# Patient Record
Sex: Male | Born: 1980 | Race: Black or African American | Hispanic: No | Marital: Married | Smoking: Former smoker
Health system: Southern US, Community
[De-identification: ages and names within clinical notes are randomized; demographics above are authoritative.]

## PROBLEM LIST (undated history)

## (undated) DIAGNOSIS — E119 Type 2 diabetes mellitus without complications: Secondary | ICD-10-CM

## (undated) DIAGNOSIS — I10 Essential (primary) hypertension: Secondary | ICD-10-CM

## (undated) DIAGNOSIS — M25569 Pain in unspecified knee: Secondary | ICD-10-CM

## (undated) DIAGNOSIS — G8929 Other chronic pain: Secondary | ICD-10-CM

---

## 2009-10-24 ENCOUNTER — Ambulatory Visit: Payer: Self-pay | Admitting: Diagnostic Radiology

## 2009-10-24 ENCOUNTER — Emergency Department (HOSPITAL_BASED_OUTPATIENT_CLINIC_OR_DEPARTMENT_OTHER): Admission: EM | Admit: 2009-10-24 | Discharge: 2009-10-24 | Payer: Self-pay | Admitting: Emergency Medicine

## 2009-12-04 ENCOUNTER — Emergency Department (HOSPITAL_BASED_OUTPATIENT_CLINIC_OR_DEPARTMENT_OTHER): Admission: EM | Admit: 2009-12-04 | Discharge: 2009-12-04 | Payer: Self-pay | Admitting: Emergency Medicine

## 2010-01-24 ENCOUNTER — Emergency Department (HOSPITAL_BASED_OUTPATIENT_CLINIC_OR_DEPARTMENT_OTHER): Admission: EM | Admit: 2010-01-24 | Discharge: 2010-01-24 | Payer: Self-pay | Admitting: Emergency Medicine

## 2010-12-04 LAB — DIFFERENTIAL
Basophils Absolute: 0.1 10*3/uL (ref 0.0–0.1)
Lymphocytes Relative: 46 % (ref 12–46)
Lymphs Abs: 3.9 10*3/uL (ref 0.7–4.0)
Monocytes Relative: 8 % (ref 3–12)
Neutrophils Relative %: 43 % (ref 43–77)

## 2010-12-04 LAB — BASIC METABOLIC PANEL
BUN: 14 mg/dL (ref 6–23)
CO2: 27 mEq/L (ref 19–32)
Calcium: 9.1 mg/dL (ref 8.4–10.5)
Creatinine, Ser: 1.2 mg/dL (ref 0.4–1.5)
GFR calc non Af Amer: >60 mL/min (ref >60)
Potassium: 3.9 mEq/L (ref 3.5–5.1)

## 2010-12-04 LAB — CBC
MCHC: 33.5 g/dL (ref 30.0–36.0)
Platelets: 274 10*3/uL (ref 150–400)
RDW: 12.4 % (ref 11.5–15.5)

## 2010-12-04 LAB — CK: Total CK: 586 U/L — ABNORMAL HIGH (ref 7–232)

## 2010-12-04 LAB — POCT CARDIAC MARKERS
CKMB, poc: 1.9 ng/mL (ref 1.0–8.0)
Myoglobin, poc: 117 ng/mL (ref 12–200)
Troponin i, poc: <0.05 ng/mL (ref 0.00–0.09)

## 2010-12-06 LAB — BASIC METABOLIC PANEL WITH GFR
Calcium: 9.8 mg/dL (ref 8.4–10.5)
Creatinine, Ser: 1.2 mg/dL (ref 0.4–1.5)
GFR calc Af Amer: >60 mL/min (ref >60)
Glucose, Bld: 105 mg/dL — ABNORMAL HIGH (ref 70–99)
Potassium: 4 meq/L (ref 3.5–5.1)
Sodium: 143 meq/L (ref 135–145)

## 2010-12-06 LAB — BASIC METABOLIC PANEL
BUN: 12 mg/dL (ref 6–23)
CO2: 30 mEq/L (ref 19–32)
Chloride: 100 mEq/L (ref 96–112)
GFR calc non Af Amer: >60 mL/min (ref >60)

## 2010-12-06 LAB — CBC
HCT: 45.5 % (ref 39.0–52.0)
Hemoglobin: 15.8 g/dL (ref 13.0–17.0)
MCHC: 34.6 g/dL (ref 30.0–36.0)
MCV: 87.4 fL (ref 78.0–100.0)
Platelets: 289 K/uL (ref 150–400)
RBC: 5.21 MIL/uL (ref 4.22–5.81)
RDW: 12.5 % (ref 11.5–15.5)
WBC: 8.9 K/uL (ref 4.0–10.5)

## 2010-12-06 LAB — POCT CARDIAC MARKERS
CKMB, poc: 2.5 ng/mL (ref 1.0–8.0)
Myoglobin, poc: 104 ng/mL (ref 12–200)
Troponin i, poc: <0.05 ng/mL (ref 0.00–0.09)

## 2011-08-13 ENCOUNTER — Emergency Department (INDEPENDENT_AMBULATORY_CARE_PROVIDER_SITE_OTHER): Payer: BC Managed Care – PPO

## 2011-08-13 ENCOUNTER — Emergency Department (HOSPITAL_BASED_OUTPATIENT_CLINIC_OR_DEPARTMENT_OTHER)
Admission: EM | Admit: 2011-08-13 | Discharge: 2011-08-13 | Disposition: A | Discharge status: Home / Self Care | Payer: BC Managed Care – PPO | Attending: Emergency Medicine | Admitting: Emergency Medicine

## 2011-08-13 ENCOUNTER — Encounter: Payer: Self-pay | Admitting: *Deleted

## 2011-08-13 DIAGNOSIS — R059 Cough, unspecified: Secondary | ICD-10-CM | POA: Insufficient documentation

## 2011-08-13 DIAGNOSIS — J40 Bronchitis, not specified as acute or chronic: Secondary | ICD-10-CM

## 2011-08-13 DIAGNOSIS — R05 Cough: Secondary | ICD-10-CM

## 2011-08-13 DIAGNOSIS — R0989 Other specified symptoms and signs involving the circulatory and respiratory systems: Secondary | ICD-10-CM

## 2011-08-13 HISTORY — DX: Essential (primary) hypertension: I10

## 2011-08-13 MED ORDER — ALBUTEROL SULFATE HFA 108 (90 BASE) MCG/ACT IN AERS
2.0000 | INHALATION_SPRAY | RESPIRATORY_TRACT | Status: DC | PRN
  Administered 2011-08-13: 2 via RESPIRATORY_TRACT
  Filled 2011-08-13: qty 6.7

## 2011-08-13 MED ORDER — ONDANSETRON 4 MG PO TBDP
4.0000 mg | ORAL_TABLET | Freq: Once | ORAL | Status: AC
  Administered 2011-08-13: 4 mg via ORAL
  Filled 2011-08-13: qty 1

## 2011-08-13 NOTE — ED Provider Notes (Signed)
History     CSN: [id number] Arrival date & time: 08/13/2011  5:06 PM   First MD Initiated Contact with Patient 08/13/11 1723      Chief Complaint  Patient presents with  . Cough  . Nasal Congestion    (Consider location/radiation/quality/duration/timing/severity/associated sxs/prior treatment) HPI Comments: Patient presents with cough, congestion for 2 days. He some nausea today and had one episode of emesis. He denies any fevers but does complain of diffuse body aches and chills. He said no sick contacts, abdominal pain, chest pain.  His cough is dry nonproductive he has had no smoke exposure.  History of hypertension states compliance with medications. Denies any diarrhea, abdominal pain, sore throat, rhinorrhea.  The history is provided by the patient.    Past Medical History  Diagnosis Date  . Hypertension     History reviewed. No pertinent past surgical history.  History reviewed. No pertinent family history.  History  Substance Use Topics  . Smoking status: Never Smoker   . Smokeless tobacco: Not on file  . Alcohol Use: No      Review of Systems  Constitutional: Positive for chills. Negative for fever, activity change and appetite change.  HENT: Positive for congestion and rhinorrhea. Negative for trouble swallowing, neck pain and neck stiffness.   Respiratory: Positive for cough. Negative for shortness of breath.   Cardiovascular: Negative for chest pain.  Gastrointestinal: Positive for nausea and vomiting. Negative for abdominal pain.  Genitourinary: Negative for dysuria and hematuria.  Musculoskeletal: Positive for myalgias and arthralgias. Negative for back pain.  Skin: Negative for rash.  Neurological: Negative for weakness and headaches.    Allergies  Review of patient's allergies indicates no known allergies.  Home Medications   Current Outpatient Rx  Name Route Sig Dispense Refill  . BENAZEPRIL HCL 20 MG PO TABS Oral Take 20 mg by mouth daily.       Marland Kitchen LORAZEPAM 1 MG PO TABS Oral Take 1 mg by mouth daily as needed. For anxiety    . PSEUDOEPHEDRINE HCL 30 MG PO TABS Oral Take 30 mg by mouth once.      Marland Kitchen PSEUDOEPHEDRINE-APAP-DM 40-981-19 MG/30ML PO LIQD Oral Take 30 mLs by mouth every 6 (six) hours as needed. For congestion       BP 153/78  Pulse 73  Temp(Src) 98.1 F (36.7 C) (Oral)  Resp 18  SpO2 100%  Physical Exam  Constitutional: He is oriented to person, place, and time. He appears well-developed and well-nourished. No distress.       texting on phone  HENT:  Head: Normocephalic and atraumatic.  Eyes: Conjunctivae are normal. Pupils are equal, round, and reactive to light.  Neck: Normal range of motion. Neck supple.       No meningismus  Cardiovascular: Normal rate, regular rhythm and normal heart sounds.   Pulmonary/Chest: Effort normal and breath sounds normal. No respiratory distress. He has no wheezes. He exhibits no tenderness.  Abdominal: Soft. There is no tenderness. There is no rebound and no guarding.  Musculoskeletal: Normal range of motion. He exhibits no edema and no tenderness.  Lymphadenopathy:    He has no cervical adenopathy.  Neurological: He is alert and oriented to person, place, and time. No cranial nerve deficit.  Skin: Skin is warm.    ED Course  Procedures (including critical care time)  Labs Reviewed - No data to display Dg Chest 2 View  08/13/2011  *RADIOLOGY REPORT*  Clinical Data: Cough and congestion.  CHEST -  2 VIEW  Comparison: Chest x-ray 10/24/2009.  Findings: The cardiac silhouette, mediastinal and hilar contours are within normal limits and stable. The lungs are clear.  No pleural effusions.  The bony thorax is intact.  IMPRESSION: Normal chest x-ray.  No change since prior study.  Original Report Authenticated By: P. Loralie Champagne, M.D.     1. Bronchitis       MDM  Cough, cold symptoms for the past day. Patient is nontoxic on exam and stable vital signs. Lungs are clear to  auscultation. We'll treat as viral syndrome with antipyretics, hydration and recheck by his primary doctor.  Chest x-ray is clear. Will treat as viral bronchitis.      Glynn Octave, MD 08/13/11 [phone]

## 2011-08-13 NOTE — ED Notes (Signed)
Pt amb to triage with quick steady gait in nad. Pt reports cough and congestion x 2 days, emesis x 1 today. Denies any fevers or other c/o.

## 2014-04-04 ENCOUNTER — Emergency Department (HOSPITAL_BASED_OUTPATIENT_CLINIC_OR_DEPARTMENT_OTHER)
Admission: EM | Admit: 2014-04-04 | Discharge: 2014-04-04 | Disposition: A | Discharge status: Home / Self Care | Payer: BC Managed Care – PPO | Attending: Emergency Medicine | Admitting: Emergency Medicine

## 2014-04-04 ENCOUNTER — Encounter (HOSPITAL_BASED_OUTPATIENT_CLINIC_OR_DEPARTMENT_OTHER): Payer: Self-pay | Admitting: Emergency Medicine

## 2014-04-04 ENCOUNTER — Emergency Department (HOSPITAL_BASED_OUTPATIENT_CLINIC_OR_DEPARTMENT_OTHER): Payer: BC Managed Care – PPO

## 2014-04-04 DIAGNOSIS — G8929 Other chronic pain: Secondary | ICD-10-CM | POA: Insufficient documentation

## 2014-04-04 DIAGNOSIS — Z79899 Other long term (current) drug therapy: Secondary | ICD-10-CM | POA: Insufficient documentation

## 2014-04-04 DIAGNOSIS — I10 Essential (primary) hypertension: Secondary | ICD-10-CM | POA: Insufficient documentation

## 2014-04-04 DIAGNOSIS — M25561 Pain in right knee: Secondary | ICD-10-CM

## 2014-04-04 DIAGNOSIS — M25569 Pain in unspecified knee: Secondary | ICD-10-CM | POA: Insufficient documentation

## 2014-04-04 HISTORY — DX: Other chronic pain: G89.29

## 2014-04-04 HISTORY — DX: Pain in unspecified knee: M25.569

## 2014-04-04 MED ORDER — TRAMADOL HCL 50 MG PO TABS: 50.0000 mg | ORAL_TABLET | Freq: Four times a day (QID) | ORAL | Status: DC | PRN

## 2014-04-04 NOTE — Discharge Instructions (Signed)

## 2014-04-04 NOTE — ED Notes (Signed)
Patient transported to X-ray 

## 2014-04-04 NOTE — ED Provider Notes (Signed)
CSN: [id number]     Arrival date & time 04/04/14  [id number] History   First MD Initiated Contact with Patient 04/04/14 [phone]     Chief Complaint  Patient presents with  . Knee Pain     (Consider location/radiation/quality/duration/timing/severity/associated sxs/prior Treatment) HPI Comments: Pt states that he was lifting furniture for his job the other day and he but his right knee into the metal frame of a recliner. Has had pain in the area since. State that it gave out on him this morning. States that he would "get fluid" on it the past. No known injury. Pt denies numbness. No swelling  The history is provided by the patient. No language interpreter was used.    Past Medical History  Diagnosis Date  . Hypertension   . Chronic knee pain    History reviewed. No pertinent past surgical history. No family history on file. History  Substance Use Topics  . Smoking status: Never Smoker   . Smokeless tobacco: Not on file  . Alcohol Use: Yes     Comment: 24 oz beer every other week    Review of Systems  Constitutional: Negative.   Respiratory: Negative.   Cardiovascular: Negative.       Allergies  Review of patient's allergies indicates no known allergies.  Home Medications   Prior to Admission medications   Medication Sig Start Date End Date Taking? Authorizing Provider  benazepril (LOTENSIN) 20 MG tablet Take 20 mg by mouth daily.      Historical Provider, MD  LORazepam (ATIVAN) 1 MG tablet Take 1 mg by mouth daily as needed. For anxiety    Historical Provider, MD  pseudoephedrine (SUDAFED) 30 MG tablet Take 30 mg by mouth once.      Historical Provider, MD  Pseudoephedrine-APAP-DM (DAYQUIL MULTI-SYMPTO) 47-829-56) 60-650-20 MG/30ML LIQD Take 30 mLs by mouth every 6 (six) hours as needed. For congestion     Historical Provider, MD   BP 138/56  Pulse 56  Temp(Src) 98.9 F (37.2 C) (Oral)  Resp 18  Ht 6\' 2"  (1.88 m)  Wt 322 lb (146.058 kg)  BMI 41.32 kg/m2  SpO2 98% Physical Exam   Nursing note and vitals reviewed. Constitutional: He appears well-developed and well-nourished.  Cardiovascular: Normal rate and regular rhythm.   Pulmonary/Chest: Effort normal and breath sounds normal.  Musculoskeletal: Normal range of motion.  Tender on the lateral right knee.full rom. No redness warmth or swelling noted to the area  Neurological: He is alert.  Skin: Skin is warm and dry.    ED Course  Procedures (including critical care time) Labs Review Labs Reviewed - No data to display  Imaging Review Dg Knee Complete 4 Views Right  04/04/2014   CLINICAL DATA:  Blow to the right knee.  Pain.  EXAM: RIGHT KNEE - COMPLETE 4+ VIEW  COMPARISON:  None.  FINDINGS: Imaged bones, joints and soft tissues appear normal.  IMPRESSION: Negative exam.   Electronically Signed   By: Drusilla Kannerhomas  Dalessio M.D.   On: 04/04/2014 10:38     EKG Interpretation None      MDM   Final diagnoses:  Right knee pain    No acute bony abnormality noted. To swelling or redness noted. Pt placed in knee sleeve for comfort. Will have pt follow up with Dr. Pearletha Forgehudnall as needed    Teressa LowerVrinda Shakeia Krus, NP 04/04/14 1112

## 2014-04-04 NOTE — ED Notes (Signed)
Right need pain that is chronic, however stayed hurting worse after moving furniture 2 days ago and putting knee in metal part of sofa.  Pain is worse with weight bearing.

## 2014-04-04 NOTE — ED Notes (Signed)
Jeremy LowerVrinda Mueller at bedside.

## 2014-04-04 NOTE — ED Provider Notes (Signed)
History/physical exam/procedure(s) were performed by non-physician practitioner and as supervising physician I was immediately available for consultation/collaboration. I have reviewed all notes and am in agreement with care and plan.   Hilario Quarryanielle S Chalsea Darko, MD 04/04/14 1450

## 2014-04-26 ENCOUNTER — Encounter (HOSPITAL_BASED_OUTPATIENT_CLINIC_OR_DEPARTMENT_OTHER): Payer: Self-pay | Admitting: Emergency Medicine

## 2014-04-26 ENCOUNTER — Emergency Department (HOSPITAL_BASED_OUTPATIENT_CLINIC_OR_DEPARTMENT_OTHER)
Admission: EM | Admit: 2014-04-26 | Discharge: 2014-04-26 | Disposition: A | Discharge status: Home / Self Care | Payer: BC Managed Care – PPO | Attending: Emergency Medicine | Admitting: Emergency Medicine

## 2014-04-26 DIAGNOSIS — M25519 Pain in unspecified shoulder: Secondary | ICD-10-CM | POA: Insufficient documentation

## 2014-04-26 DIAGNOSIS — M7552 Bursitis of left shoulder: Secondary | ICD-10-CM

## 2014-04-26 DIAGNOSIS — M7520 Bicipital tendinitis, unspecified shoulder: Secondary | ICD-10-CM | POA: Diagnosis not present

## 2014-04-26 DIAGNOSIS — M75100 Unspecified rotator cuff tear or rupture of unspecified shoulder, not specified as traumatic: Secondary | ICD-10-CM | POA: Diagnosis not present

## 2014-04-26 DIAGNOSIS — Z79899 Other long term (current) drug therapy: Secondary | ICD-10-CM | POA: Diagnosis not present

## 2014-04-26 DIAGNOSIS — I10 Essential (primary) hypertension: Secondary | ICD-10-CM | POA: Insufficient documentation

## 2014-04-26 DIAGNOSIS — G8929 Other chronic pain: Secondary | ICD-10-CM | POA: Insufficient documentation

## 2014-04-26 DIAGNOSIS — M7522 Bicipital tendinitis, left shoulder: Secondary | ICD-10-CM

## 2014-04-26 DIAGNOSIS — IMO0002 Reserved for concepts with insufficient information to code with codable children: Secondary | ICD-10-CM | POA: Diagnosis not present

## 2014-04-26 MED ORDER — HYDROCODONE-ACETAMINOPHEN 5-325 MG PO TABS: 2.0000 | ORAL_TABLET | ORAL | Status: DC | PRN

## 2014-04-26 NOTE — ED Notes (Signed)
C/o left shoulder pain x 1 week-denies specific injury-does lift at work

## 2014-04-26 NOTE — Discharge Instructions (Signed)
Biceps Tendon Tendinitis (Proximal) and Tenosynovitis with Rehab Tendonitis and tenosynovitis involve inflammation of the tendon and the tendon lining (sheath). The proximal biceps tendon is vulnerable to tendonitis and tenosynovitis, which causes pain and discomfort in the front of the shoulder and upper arm. The tendon lining secretes a fluid that helps lubricate the tendon, allowing for proper function without pain. When the tendon and its lining become inflamed, the tendon can no longer glide smoothly, causing pain. The proximal biceps tendon connects the biceps muscle to two bones of the shoulder. It is important for proper function of the elbow and turning the palm upward (supination) using the wrist. Proximal biceps tendon tendinitis may include a grade 1 or 2 strain of the tendon. Grade 1 strains involve a slight pull of the tendon without signs of tearing and no observed tendon lengthening. There is also no loss of strength. Grade 2 strains involve small tears in the tendon fibers. The tendon or muscle is stretched and strength is usually decreased.  SYMPTOMS   Pain, tenderness, swelling, warmth, or redness over the front of the shoulder.  Pain that gets worse with shoulder and elbow use, especially against resistance.  Limited motion of the shoulder or elbow.  Crackling sound (crepitation) when the tendon or shoulder is moved or touched. CAUSES  The symptoms of biceps tendonitis are due to inflammation of the tendon. Inflammation may be caused by:  Strain from sudden increase in amount or intensity of activity.  Direct blow or injury to the elbow (uncommon).  Overuse or repetitive elbow bending or wrist rotation, particularly when turning the palm up, or with elbow hyperextension. RISK INCREASES WITH:  Sports that involve contact or overhead arm activity (throwing sports, gymnastics, weightlifting, bodybuilding, rock climbing).  Heavy labor.  Poor strength and  flexibility.  Failure to warm up properly before activity. PREVENTION  Warm up and stretch properly before activity.  Allow time for recovery between activities.  Maintain physical fitness:  Strength, flexibility, and endurance.  Cardiovascular fitness.  Learn and use proper exercise technique. PROGNOSIS  With proper treatment, proximal biceps tendon tendonitis and tenosynovitis is usually curable within 6 weeks. Healing is usually quicker if the cause was a direct blow, not overuse.  RELATED COMPLICATIONS   Longer healing time if not properly treated or if not given enough time to heal.  Chronically inflamed tendon that causes persistent pain with activity, that may progress to constant pain and potentially rupture of the tendon.  Recurring symptoms, especially if activity is resumed too soon or with overuse, a direct blow, or use of poor exercise technique. TREATMENT Treatment first involves ice and medicine, to reduce pain and inflammation. It is helpful to modify activities that cause pain, to reduce the chances of causing the condition to get worse. Strengthening and stretching exercises should be performed to promote proper use of the muscles of the shoulder. These exercises may be performed at home or with a therapist. Other treatments may be given such as ultrasound or heat therapy. A corticosteroid injection may be recommended to help reduce inflammation of the tendon lining. Surgery is usually not necessary. Sometimes, if symptoms last for greater than 6 months, surgery will be advised to detach the tendon and re-insert it into the arm bone. Surgery to correct other shoulder problems that may be contributing to tendinitis may be advised before surgery for the tendinitis itself.  MEDICATION  If pain medicine is needed, nonsteroidal anti-inflammatory medicines (aspirin and ibuprofen), or other minor pain relievers (  acetaminophen), are often advised.  Do not take pain medicine  for 7 days before surgery.  Prescription pain relievers may be given if your caregiver thinks they are needed. Use only as directed and only as much as you need.  Corticosteroid injections may be given. These injections should only be used on the most severe cases, as one can only receive a limited number of them. HEAT AND COLD   Cold treatment (icing) should be applied for 10 to 15 minutes every 2 to 3 hours for inflammation and pain, and immediately after activity that aggravates your symptoms. Use ice packs or an ice massage.  Heat treatment may be used before performing stretching and strengthening activities prescribed by your caregiver, physical therapist, or athletic trainer. Use a heat pack or a warm water soak. SEEK MEDICAL CARE IF:   Symptoms get worse or do not improve in 2 weeks, despite treatment.  New, unexplained symptoms develop. (Drugs used in treatment may produce side effects.) EXERCISES RANGE OF MOTION (ROM) AND EXERCISES - Biceps Tendon (Proximal) and Tenosynovitis These exercises may help you when beginning to rehabilitate your injury. Your symptoms may go away with or without further involvement from your physician, physical therapist, or athletic trainer. While completing these exercises, remember:   Restoring tissue flexibility helps normal motion to return to the joints. This allows healthier, less painful movement and activity.  An effective stretch should be held for at least 30 seconds.  A stretch should never be painful. You should only feel a gentle lengthening or release in the stretched tissue. STRETCH - Flexion, Standing  Stand with good posture. With an underhand grip on your right / left hand and an overhand grip on the opposite hand, grasp a broomstick or cane so that your hands are a little more than shoulder width apart.  Keeping your right / left elbow straight and shoulder muscles relaxed, push the stick with your opposite hand to raise your right  / left arm in front of your body and then overhead. Raise your arm until you feel a stretch in your right / left shoulder, but before you have increased shoulder pain.  Try to avoid shrugging your right / left shoulder as your arm rises, by keeping your shoulder blade tucked down and toward your mid-back spine. Hold for __________ seconds.  Slowly return to the starting position. Repeat __________ times. Complete this exercise __________ times per day. STRETCH - Abduction, Supine  Lie on your back. With an underhand grip on your right / left hand and an overhand grip on the opposite hand, grasp a broomstick or cane so that your hands are a little more than shoulder width apart.  Keeping your right / left elbow straight and shoulder muscles relaxed, push the stick with your opposite hand to raise your right / left arm out to the side of your body and then overhead. Raise your arm until you feel a stretch in your right / left shoulder, but before you have increased shoulder pain.  Try to avoid shrugging your right / left shoulder as your arm rises, by keeping your shoulder blade tucked down and toward your mid-back spine. Hold for __________ seconds.  Slowly return to the starting position. Repeat __________ times. Complete this exercise __________ times per day. ROM - Flexion, Active-Assisted  Lie on your back. You may bend your knees for comfort.  Grasp a broomstick or cane so your hands are about shoulder width apart. Your right / left hand should  grip the end of the stick so that your hand is positioned "thumbs-up," as if you were about to shake hands.  Using your healthy arm to lead, raise your right / left arm overhead until you feel a gentle stretch in your shoulder. Hold for __________ seconds.  Use the stick to assist in returning your right / left arm to its starting position. Repeat __________ times. Complete this exercise __________ times per day.  STRETCH - Flexion, Standing    Stand facing a wall. Walk your right / left fingers up the wall until you feel a moderate stretch in your shoulder. As your hand gets higher, you may need to step closer to the wall or use a door frame to walk through.  Try to avoid shrugging your right / left shoulder as your arm rises, by keeping your shoulder blade tucked down and toward your mid-back spine.  Hold for __________ seconds. Use your other hand, if needed, to ease out of the stretch and return to the starting position. Repeat __________ times. Complete this exercise __________ times per day.  ROM - Internal Rotation   Using underhand grips, grasp a stick behind your back with both hands.  While standing upright with good posture, slide the stick up your back until you feel a mild stretch in the front of your shoulder.  Hold for __________ seconds. Slowly return to your starting position. Repeat __________ times. Complete this exercise __________ times per day.  STRETCH - Internal Rotation  Place your right / left hand behind your back, palm-up.  Throw a towel or belt over your opposite shoulder. Grasp the towel with your right / left hand.  While keeping an upright posture, gently pull up on the towel until you feel a stretch in the front of your right / left shoulder.  Avoid shrugging your right / left shoulder as your arm rises, by keeping your shoulder blade tucked down and toward your mid-back spine.  Hold for __________ seconds. Release the stretch by lowering your opposite hand. Repeat __________ times. Complete this exercise __________ times per day. STRENGTHENING EXERCISES - Biceps Tendon Tendinitis (Proximal) and Tenosynovitis These exercises may help you regain your strength after your physician has discontinued your restraint in a cast or brace. They may resolve your symptoms with or without further involvement from your physician, physical therapist or athletic trainer. While completing these exercises,  remember:   Muscles can gain both the endurance and the strength needed for everyday activities through controlled exercises.  Complete these exercises as instructed by your physician, physical therapist or athletic trainer. Increase the resistance and repetitions only as guided.  You may experience muscle soreness or fatigue, but the pain or discomfort you are trying to eliminate should never worsen during these exercises. If this pain does get worse, stop and make sure you are following the directions exactly. If the pain is still present after adjustments, discontinue the exercise until you can discuss the trouble with your caregiver. STRENGTH - Elbow Flexors, Isometric  Stand or sit upright on a firm surface. Place your right / left arm so that your hand is palm-up and at the height of your waist.  Place your opposite hand on top of your forearm. Gently push down as your right / left arm resists. Push as hard as you can with both arms, without causing any pain or movement at your right / left elbow. Hold this stationary position for __________ seconds.  Gradually release the tension in both  arms. Allow your muscles to relax completely before repeating. Repeat __________ times. Complete this exercise __________ times per day. STRENGTH - Shoulder Flexion, Isometric  With good posture and facing a wall, stand or sit about 4-6 inches away.  Keeping your right / left elbow straight, gently press the top of your fist into the wall. Increase the pressure gradually until you are pressing as hard as you can, without shrugging your shoulder or increasing any shoulder discomfort.  Hold for __________ seconds.  Release the tension slowly. Relax your shoulder muscles completely before you start the next repetition. Repeat __________ times. Complete this exercise __________ times per day.  STRENGTH - Elbow Flexors, Supinated  With good posture, stand or sit on a firm chair without armrests. Allow  your right / left arm to rest at your side with your palm facing forward.  Holding a __________ weight, or gripping a rubber exercise band or tubing,  bring your hand toward your shoulder.  Allow your muscles to control the resistance as your hand returns to your side. Repeat __________ times. Complete this exercise __________ times per day.  STRENGTH - Shoulder Flexion  Stand or sit with good posture. Grasp a __________ weight, or an exercise band or tubing, so that your hand is "thumbs-up," like when you shake hands.  Slowly lift your right / left arm as far as you can, without increasing any shoulder pain. At first, many people can only raise their hand to shoulder height.  Avoid shrugging your right / left shoulder as your arm rises, by keeping your shoulder blade tucked down and toward your mid-back spine.  Hold for __________ seconds. Control the descent of your hand as you slowly return to your starting position. Repeat __________ times. Complete this exercise __________ times per day. Document Released: 09/02/2005 Document Revised: 11/25/2011 Document Reviewed: 12/15/2008 [facility] Patient Information 2015 [place], [place]. This information is not intended to replace advice given to you by your health care provider. Make sure you discuss any questions you have with your health care provider.  Bursitis Bursitis is a swelling and soreness (inflammation) of a fluid-filled sac (bursa) that overlies and protects a joint. It can be caused by injury, overuse of the joint, arthritis or infection. The joints most likely to be affected are the elbows, shoulders, hips and knees. HOME CARE INSTRUCTIONS   Apply ice to the affected area for 15-20 minutes each hour while awake for 2 days. Put the ice in a plastic bag and place a towel between the bag of ice and your skin.  Rest the injured joint as much as possible, but continue to put the joint through a full range of motion, 4 times per day. (The  shoulder joint especially becomes rapidly "frozen" if not used.) When the pain lessens, begin normal slow movements and usual activities.  Only take over-the-counter or prescription medicines for pain, discomfort or fever as directed by your caregiver.  Your caregiver may recommend draining the bursa and injecting medicine into the bursa. This may help the healing process.  Follow all instructions for follow-up with your caregiver. This includes any orthopedic referrals, physical therapy and rehabilitation. Any delay in obtaining necessary care could result in a delay or failure of the bursitis to heal and chronic pain. SEEK IMMEDIATE MEDICAL CARE IF:   Your pain increases even during treatment.  You develop an oral temperature above 102 F (38.9 C) and have heat and inflammation over the involved bursa. MAKE SURE YOU:   Understand  these instructions.  Will watch your condition.  Will get help right away if you are not doing well or get worse. Document Released: 08/30/2000 Document Revised: 11/25/2011 Document Reviewed: 11/22/2013 [facility] Patient Information 2015 [place]. This information is not intended to replace advice given to you by your health care provider. Make sure you discuss any questions you have with your health care provider.  Arm Sling Use A sling is used to:  Limit how much your arm moves.  Make you more comfortable.  Support your arm. The sling fits well if:  Your elbow rests in the bottom and corner pocket.  Only your fingers show at the opening. Your wrist should fit inside and be supported by the sling.  The strap goes around your shoulder or neck for support.  Your arm is fairly level with your hand, slightly higher than your elbow. HOME CARE   Adjust the sling to keep the hand inside. Slings tend to slip, making the elbow point up. Tug the elbow back into place.  The fingers should feel warm and be a normal color.  Try to keep the palm of  the hand toward the body while wearing the sling.  Take the sling off when going to sleep if this is okay with your doctor.  Use an extra pillow at night to protect the arm. Slide the arm between a pillow and the cover.  Take baths or showers as told by your doctor.  Only take medicine as told by your doctor. GET HELP RIGHT AWAY IF:   The fingers turn cold or start to tingle.  The arm pain gets worse.  The pain is not helped by medicine or by adjusting the sling. MAKE SURE YOU:   Understand these instructions.  Will watch this condition.  Will get help right away if you are not doing well or get worse. Document Released: 02/19/2008 Document Revised: 11/25/2011 Document Reviewed: 02/19/2008 [facility] Patient Information 2015 [place]. This information is not intended to replace advice given to you by your health care provider. Make sure you discuss any questions you have with your health care provider.

## 2014-04-26 NOTE — ED Provider Notes (Signed)
CSN: [id number]     Arrival date & time 04/26/14  1327 History   First MD Initiated Contact with Patient 04/26/14 1353     Chief Complaint  Patient presents with  . Shoulder Pain     (Consider location/radiation/quality/duration/timing/severity/associated sxs/prior Treatment) HPI Comments: Presents to the ER for evaluation of left shoulder pain. Patient denies direct injury. Pain is in the front part of the shoulder. 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 He notices that about halfway finished if he starts having aching pain in the shoulder and by the end of shift is severe. It gets better when he goes home and take ibuprofen.  Patient is a 33 y.o. male presenting with shoulder pain.  Shoulder Pain    Past Medical History  Diagnosis Date  . Hypertension   . Chronic knee pain    History reviewed. No pertinent past surgical history. No family history on file. History  Substance Use Topics  . Smoking status: Never Smoker   . Smokeless tobacco: Not on file  . Alcohol Use: Yes    Review of Systems  Musculoskeletal: Positive for arthralgias.      Allergies  Review of patient's allergies indicates no known allergies.  Home Medications   Prior to Admission medications   Medication Sig Start Date End Date Taking? Authorizing Provider  AMLODIPINE BESYLATE PO Take 20 mg by mouth.   Yes Historical Provider, MD  ibuprofen (ADVIL,MOTRIN) 200 MG tablet Take 200 mg by mouth every 6 (six) hours as needed.   Yes Historical Provider, MD  benazepril (LOTENSIN) 20 MG tablet Take 20 mg by mouth daily.      Historical Provider, MD  LORazepam (ATIVAN) 1 MG tablet Take 1 mg by mouth daily as needed. For anxiety    Historical Provider, MD  pseudoephedrine (SUDAFED) 30 MG tablet Take 30 mg by mouth once.      Historical Provider, MD  Pseudoephedrine-APAP-DM (DAYQUIL MULTI-SYMPTOM) 40-981-19 MG/30ML LIQD Take 30 mLs by mouth every 6 (six) hours as needed. For congestion      Historical Provider, MD  traMADol (ULTRAM) 50 MG tablet Take 1 tablet (50 mg total) by mouth every 6 (six) hours as needed. 04/04/14   Teressa Lower, NP   BP 150/84  Pulse 68  Temp(Src) 97.8 F (36.6 C) (Oral)  Resp 16  Ht 6\' 2"  (1.88 m)  Wt 321 lb (145.605 kg)  BMI 41.20 kg/m2  SpO2 100% Physical Exam  Musculoskeletal:       Left shoulder: He exhibits tenderness (Subacromial bursa region as well as proximal biceps tendon). He exhibits normal range of motion and no deformity.  Neurological: He is alert. He has normal strength. No cranial nerve deficit or sensory deficit. GCS eye subscore is 4. GCS verbal subscore is 5. GCS motor subscore is 6.    ED Course  Procedures (including critical care time) Labs Review Labs Reviewed - No data to display  Imaging Review No results found.   EKG Interpretation None      MDM   Final diagnoses:  None   subacromial bursitis versus proximal biceps tendinitis  Patient presents with pain in the anterior portion of the shoulder directly over the subacromial bursa and proximal biceps tendon region. It is related to repetitive motion and lifting. No x-rays necessary based on his exam. Patient counseled that he needs to rest the area, will be treated with anti-inflammatory medication and analgesia. Followup with Doctor Pearletha Forge, sports medicine.   Gilda Crease,  MD 04/26/14 1452

## 2014-06-27 ENCOUNTER — Emergency Department (HOSPITAL_BASED_OUTPATIENT_CLINIC_OR_DEPARTMENT_OTHER)
Admission: EM | Admit: 2014-06-27 | Discharge: 2014-06-27 | Disposition: A | Discharge status: Home / Self Care | Payer: BC Managed Care – PPO | Attending: Emergency Medicine | Admitting: Emergency Medicine

## 2014-06-27 ENCOUNTER — Encounter (HOSPITAL_BASED_OUTPATIENT_CLINIC_OR_DEPARTMENT_OTHER): Payer: Self-pay | Admitting: Emergency Medicine

## 2014-06-27 DIAGNOSIS — Z79899 Other long term (current) drug therapy: Secondary | ICD-10-CM | POA: Insufficient documentation

## 2014-06-27 DIAGNOSIS — G8929 Other chronic pain: Secondary | ICD-10-CM | POA: Diagnosis not present

## 2014-06-27 DIAGNOSIS — M25511 Pain in right shoulder: Secondary | ICD-10-CM

## 2014-06-27 DIAGNOSIS — I10 Essential (primary) hypertension: Secondary | ICD-10-CM | POA: Insufficient documentation

## 2014-06-27 DIAGNOSIS — Z720 Tobacco use: Secondary | ICD-10-CM | POA: Insufficient documentation

## 2014-06-27 NOTE — ED Provider Notes (Signed)
CSN: [id number]     Arrival date & time 06/27/14  0805 History   First MD Initiated Contact with Patient 06/27/14 [phone]     Chief Complaint  Patient presents with  . Arm Injury     (Consider location/radiation/quality/duration/timing/severity/associated sxs/prior Treatment) Patient is a 33 y.o. male presenting with extremity pain. The history is provided by the patient. No language interpreter was used.  Extremity Pain This is a new problem. The current episode started more than 2 days ago. Episode frequency: intermittent. Pertinent negatives include no chest pain, no abdominal pain, no headaches and no shortness of breath. Associated symptoms comments: 3 brief episodes of R hand tingling, none currently. Exacerbated by: lifint. The symptoms are relieved by rest. He has tried rest for the symptoms. The treatment provided mild relief.    Past Medical History  Diagnosis Date  . Hypertension   . Chronic knee pain    History reviewed. No pertinent past surgical history. No family history on file. History  Substance Use Topics  . Smoking status: Current Every Day Smoker -- 0.50 packs/day  . Smokeless tobacco: Not on file  . Alcohol Use: 1.2 oz/week    2 Cans of beer per week    Review of Systems  Constitutional: Negative for fever, activity change, appetite change and fatigue.  HENT: Negative for congestion, facial swelling, rhinorrhea and trouble swallowing.   Eyes: Negative for photophobia and pain.  Respiratory: Negative for cough, chest tightness and shortness of breath.   Cardiovascular: Negative for chest pain and leg swelling.  Gastrointestinal: Negative for nausea, vomiting, abdominal pain, diarrhea and constipation.  Endocrine: Negative for polydipsia and polyuria.  Genitourinary: Negative for dysuria, urgency, decreased urine volume and difficulty urinating.  Musculoskeletal: Negative for back pain and gait problem.  Skin: Negative for color change, rash and wound.    Allergic/Immunologic: Negative for immunocompromised state.  Neurological: Negative for dizziness, facial asymmetry, speech difficulty, weakness, numbness and headaches.  Psychiatric/Behavioral: Negative for confusion, decreased concentration and agitation.      Allergies  Review of patient's allergies indicates no known allergies.  Home Medications   Prior to Admission medications   Medication Sig Start Date End Date Taking? Authorizing Provider  AMLODIPINE BESYLATE PO Take 20 mg by mouth.    Historical Provider, MD  benazepril (LOTENSIN) 20 MG tablet Take 20 mg by mouth daily.      Historical Provider, MD  HYDROcodone-acetaminophen (NORCO/VICODIN) 5-325 MG per tablet Take 2 tablets by mouth every 4 (four) hours as needed for moderate pain. 04/26/14   Gilda Creasehristopher J. Pollina, MD  ibuprofen (ADVIL,MOTRIN) 200 MG tablet Take 200 mg by mouth every 6 (six) hours as needed.    Historical Provider, MD  LORazepam (ATIVAN) 1 MG tablet Take 1 mg by mouth daily as needed. For anxiety    Historical Provider, MD  pseudoephedrine (SUDAFED) 30 MG tablet Take 30 mg by mouth once.      Historical Provider, MD  Pseudoephedrine-APAP-DM (DAYQUIL MULTI-SYMPTO) 69-629-52) 60-650-20 MG/30ML LIQD Take 30 mLs by mouth every 6 (six) hours as needed. For congestion     Historical Provider, MD  traMADol (ULTRAM) 50 MG tablet Take 1 tablet (50 mg total) by mouth every 6 (six) hours as needed. 04/04/14   Teressa LowerVrinda Pickering, NP   BP 133/74  Pulse 62  Temp(Src) 97.5 F (36.4 C) (Oral)  Resp 16  Ht 6\' 2"  (1.88 m)  Wt 317 lb (143.79 kg)  BMI 40.68 kg/m2  SpO2 98% Physical Exam  Constitutional: He  is oriented to person, place, and time. He appears well-developed and well-nourished. No distress.  HENT:  Head: Normocephalic and atraumatic.  Mouth/Throat: No oropharyngeal exudate.  Eyes: Pupils are equal, round, and reactive to light.  Neck: Normal range of motion. Neck supple.  Cardiovascular: Normal rate, regular rhythm and  normal heart sounds.  Exam reveals no gallop and no friction rub.   No murmur heard. Pulmonary/Chest: Effort normal and breath sounds normal. No respiratory distress. He has no wheezes. He has no rales.  Abdominal: Soft. Bowel sounds are normal. He exhibits no distension and no mass. There is no tenderness. There is no rebound and no guarding.  Musculoskeletal: Normal range of motion. He exhibits no edema.       Right shoulder: He exhibits tenderness. He exhibits normal range of motion and no bony tenderness.       Arms: Neurological: He is alert and oriented to person, place, and time.  Skin: Skin is warm and dry.  Psychiatric: He has a normal mood and affect.    ED Course  Procedures (including critical care time) Labs Review Labs Reviewed - No data to display  Imaging Review No results found.   EKG Interpretation None      MDM   Final diagnoses:  Right anterior shoulder pain    Pt is a 33 y.o. male with Pmhx as above who presents with 3 days of R shoulder pain.   Patient presents with pain in the R anterior portion of the shoulder/directly over the subacromial bursa as well as the R trapezius. He also reports intermittent tingling of R hand, none currently.  No direct trauma, but lifts heavy furniture at work.  No specific bony tenderness. Suspect bursitis vs rotator cuff injury. No x-rays necessary based on his exam. Patient counseled that he needs to rest the area, will be treated with anti-inflammatory medication.  Is wants to take ibuprofen which he has at home. Followup with Doctor Hudnall, sports medicine in 1 week if still having pain.       Toy CookeyMegan Docherty, MD 06/27/14 [phone]

## 2014-06-27 NOTE — Discharge Instructions (Signed)
Rotator Cuff Injury Rotator cuff injury is any type of injury to the set of muscles and tendons that make up the stabilizing unit of your shoulder. This unit holds the ball of your upper arm bone (humerus) in the socket of your shoulder blade (scapula).  CAUSES Injuries to your rotator cuff most commonly come from sports or activities that cause your arm to be moved repeatedly over your head. Examples of this include throwing, weight lifting, swimming, or racquet sports. Long lasting (chronic) irritation of your rotator cuff can cause soreness and swelling (inflammation), bursitis, and eventual damage to your tendons, such as a tear (rupture). SIGNS AND SYMPTOMS Acute rotator cuff tear:  Sudden tearing sensation followed by severe pain shooting from your upper shoulder down your arm toward your elbow.  Decreased range of motion of your shoulder because of pain and muscle spasm.  Severe pain.  Inability to raise your arm out to the side because of pain and loss of muscle power (large tears). Chronic rotator cuff tear:  Pain that usually is worse at night and may interfere with sleep.  Gradual weakness and decreased shoulder motion as the pain worsens.  Decreased range of motion. Rotator cuff tendinitis:  Deep ache in your shoulder and the outside upper arm over your shoulder.  Pain that comes on gradually and becomes worse when lifting your arm to the side or turning it inward. DIAGNOSIS Rotator cuff injury is diagnosed through a medical history, physical exam, and imaging exam. The medical history helps determine the type of rotator cuff injury. Your health care provider will look at your injured shoulder, feel the injured area, and ask you to move your shoulder in different positions. X-ray exams typically are done to rule out other causes of shoulder pain, such as fractures. MRI is the exam of choice for the most severe shoulder injuries because the images show muscles and tendons.    TREATMENT  Chronic tear:  Medicine for pain, such as acetaminophen or ibuprofen.  Physical therapy and range-of-motion exercises may be helpful in maintaining shoulder function and strength.  Steroid injections into your shoulder joint.  Surgical repair of the rotator cuff if the injury does not heal with noninvasive treatment. Acute tear:  Anti-inflammatory medicines such as ibuprofen and naproxen to help reduce pain and swelling.  A sling to help support your arm and rest your rotator cuff muscles. Long-term use of a sling is not advised. It may cause significant stiffening of the shoulder joint.  Surgery may be considered within a few weeks, especially in younger, active people, to return the shoulder to full function.  Indications for surgical treatment include the following:  Age younger than 60 years.  Rotator cuff tears that are complete.  Physical therapy, rest, and anti-inflammatory medicines have been used for 6-8 weeks, with no improvement.  Employment or sporting activity that requires constant shoulder use. Tendinitis:  Anti-inflammatory medicines such as ibuprofen and naproxen to help reduce pain and swelling.  A sling to help support your arm and rest your rotator cuff muscles. Long-term use of a sling is not advised. It may cause significant stiffening of the shoulder joint.  Severe tendinitis may require:  Steroid injections into your shoulder joint.  Physical therapy.  Surgery. HOME CARE INSTRUCTIONS   Apply ice to your injury:  Put ice in a plastic bag.  Place a towel between your skin and the bag.  Leave the ice on for 20 minutes, 2-3 times a day.  If you   have a shoulder immobilizer (sling and straps), wear it until told otherwise by your health care provider.  You may want to sleep on several pillows or in a recliner at night to lessen swelling and pain.  Only take over-the-counter or prescription medicines for pain, discomfort, or fever as  directed by your health care provider.  Do simple hand squeezing exercises with a soft rubber ball to decrease hand swelling. SEEK MEDICAL CARE IF:   Your shoulder pain increases, or new pain or numbness develops in your arm, hand, or fingers.  Your hand or fingers are colder than your other hand. SEEK IMMEDIATE MEDICAL CARE IF:   Your arm, hand, or fingers are numb or tingling.  Your arm, hand, or fingers are increasingly swollen and painful, or they turn white or blue. MAKE SURE YOU:  Understand these instructions.  Will watch your condition.  Will get help right away if you are not doing well or get worse. Document Released: 08/30/2000 Document Revised: 09/07/2013 Document Reviewed: 04/14/2013 [employer] Patient Information 2015 [employer]. This information is not intended to replace advice given to you by your health care provider. Make sure you discuss any questions you have with your health care provider.  

## 2014-06-27 NOTE — ED Notes (Signed)
Pain in right shoulder and intermittent numbness in right arm since Saturday - 2 days ago.

## 2015-05-01 ENCOUNTER — Encounter (HOSPITAL_BASED_OUTPATIENT_CLINIC_OR_DEPARTMENT_OTHER): Payer: Self-pay | Admitting: *Deleted

## 2015-05-01 ENCOUNTER — Emergency Department (HOSPITAL_BASED_OUTPATIENT_CLINIC_OR_DEPARTMENT_OTHER)
Admission: EM | Admit: 2015-05-01 | Discharge: 2015-05-01 | Disposition: A | Discharge status: Home / Self Care | Payer: Self-pay | Attending: Emergency Medicine | Admitting: Emergency Medicine

## 2015-05-01 DIAGNOSIS — R319 Hematuria, unspecified: Secondary | ICD-10-CM | POA: Insufficient documentation

## 2015-05-01 DIAGNOSIS — G8929 Other chronic pain: Secondary | ICD-10-CM | POA: Insufficient documentation

## 2015-05-01 DIAGNOSIS — R300 Dysuria: Secondary | ICD-10-CM | POA: Insufficient documentation

## 2015-05-01 DIAGNOSIS — Z720 Tobacco use: Secondary | ICD-10-CM | POA: Insufficient documentation

## 2015-05-01 DIAGNOSIS — I10 Essential (primary) hypertension: Secondary | ICD-10-CM | POA: Insufficient documentation

## 2015-05-01 DIAGNOSIS — Z79899 Other long term (current) drug therapy: Secondary | ICD-10-CM | POA: Insufficient documentation

## 2015-05-01 LAB — URINALYSIS, ROUTINE W REFLEX MICROSCOPIC
BILIRUBIN URINE: NEGATIVE
Glucose, UA: NEGATIVE mg/dL
KETONES UR: NEGATIVE mg/dL
Leukocytes, UA: NEGATIVE
NITRITE: NEGATIVE
PROTEIN: NEGATIVE mg/dL
Specific Gravity, Urine: 1.001 — ABNORMAL LOW (ref 1.005–1.030)
UROBILINOGEN UA: 0.2 mg/dL (ref 0.0–1.0)
pH: 6.5 (ref 5.0–8.0)

## 2015-05-01 LAB — URINE MICROSCOPIC-ADD ON

## 2015-05-01 NOTE — ED Notes (Signed)
Hematuria today.  

## 2015-05-01 NOTE — Discharge Instructions (Signed)
Hematuria Follow up with urology. Return if you are unable to pass urine. Hematuria is blood in your urine. It can be caused by a bladder infection, kidney infection, prostate infection, kidney stone, or cancer of your urinary tract. Infections can usually be treated with medicine, and a kidney stone usually will pass through your urine. If neither of these is the cause of your hematuria, further workup to find out the reason may be needed. It is very important that you tell your health care provider about any blood you see in your urine, even if the blood stops without treatment or happens without causing pain. Blood in your urine that happens and then stops and then happens again can be a symptom of a very serious condition. Also, pain is not a symptom in the initial stages of many urinary cancers. HOME CARE INSTRUCTIONS   Drink lots of fluid, 3-4 quarts a day. If you have been diagnosed with an infection, cranberry juice is especially recommended, in addition to large amounts of water.  Avoid caffeine, tea, and carbonated beverages because they tend to irritate the bladder.  Avoid alcohol because it may irritate the prostate.  Take all medicines as directed by your health care provider.  If you were prescribed an antibiotic medicine, finish it all even if you start to feel better.  If you have been diagnosed with a kidney stone, follow your health care provider's instructions regarding straining your urine to catch the stone.  Empty your bladder often. Avoid holding urine for long periods of time.  After a bowel movement, women should cleanse front to back. Use each tissue only once.  Empty your bladder before and after sexual intercourse if you are a male. SEEK MEDICAL CARE IF:  You develop back pain.  You have a fever.  You have a feeling of sickness in your stomach (nausea) or vomiting.  Your symptoms are not better in 3 days. Return sooner if you are getting worse. SEEK  IMMEDIATE MEDICAL CARE IF:   You develop severe vomiting and are unable to keep the medicine down.  You develop severe back or abdominal pain despite taking your medicines.  You begin passing a large amount of blood or clots in your urine.  You feel extremely weak or faint, or you pass out. MAKE SURE YOU:   Understand these instructions.  Will watch your condition.  Will get help right away if you are not doing well or get worse. Document Released: 09/02/2005 Document Revised: 01/17/2014 Document Reviewed: 05/03/2013 [facility] Patient Information 2015 [place], [place]. This information is not intended to replace advice given to you by your health care provider. Make sure you discuss any questions you have with your health care provider.

## 2015-05-01 NOTE — ED Provider Notes (Signed)
CSN: [id number]     Arrival date & time 05/01/15  1401 History   First MD Initiated Contact with Patient 05/01/15 1409     Chief Complaint  Patient presents with  . Hematuria     (Consider location/radiation/quality/duration/timing/severity/associated sxs/prior Treatment) Patient is a 34 y.o. male presenting with hematuria. The history is provided by the patient and the spouse. No language interpreter was used.  Hematuria Pertinent negatives include no fever.  Jeremy Mueller is a 34 y.o male with a history of hypertension who presents for dysuria and hematuria that occurred 5 hours ago. He states while urinating his mother-in-law called him and he did not want to be rude so he clamped down on the penis with his hand. He had no pain initially. After going back up after he states he saw a few drops of blood in the toilet and when he went to wipe the end of the penis there was blood noted on the tissue. He then proceeded to have pain with urination.  He denies any fever, chills, urinary frequency, rectal pain, abdominal pain, nausea, vomiting, history of STD, or back pain.  Past Medical History  Diagnosis Date  . Hypertension   . Chronic knee pain    History reviewed. No pertinent past surgical history. No family history on file. Social History  Substance Use Topics  . Smoking status: Current Every Day Smoker -- 0.50 packs/day  . Smokeless tobacco: None  . Alcohol Use: 1.2 oz/week    2 Cans of beer per week    Review of Systems  Constitutional: Negative for fever.  Genitourinary: Positive for dysuria and hematuria. Negative for frequency, flank pain, decreased urine volume, discharge, penile swelling, scrotal swelling, genital sores, penile pain and testicular pain.      Allergies  Review of patient's allergies indicates no known allergies.  Home Medications   Prior to Admission medications   Medication Sig Start Date End Date Taking? Authorizing Provider  AMLODIPINE BESYLATE  PO Take 20 mg by mouth.    Historical Provider, MD  benazepril (LOTENSIN) 20 MG tablet Take 20 mg by mouth daily.      Historical Provider, MD  HYDROcodone-acetaminophen (NORCO/VICODIN) 5-325 MG per tablet Take 2 tablets by mouth every 4 (four) hours as needed for moderate pain. 04/26/14   Gilda Crease, MD  ibuprofen (ADVIL,MOTRIN) 200 MG tablet Take 200 mg by mouth every 6 (six) hours as needed.    Historical Provider, MD  LORazepam (ATIVAN) 1 MG tablet Take 1 mg by mouth daily as needed. For anxiety    Historical Provider, MD  pseudoephedrine (SUDAFED) 30 MG tablet Take 30 mg by mouth once.      Historical Provider, MD  Pseudoephedrine-APAP-DM (DAYQUIL MULTI-SYMPTOM) 40-981-19 MG/30ML LIQD Take 30 mLs by mouth every 6 (six) hours as needed. For congestion     Historical Provider, MD  traMADol (ULTRAM) 50 MG tablet Take 1 tablet (50 mg total) by mouth every 6 (six) hours as needed. 04/04/14   Teressa Lower, NP   BP 150/80 mmHg  Pulse 86  Temp(Src) 98.6 F (37 C) (Oral)  Resp 16  Ht 6' 2.5" (1.892 m)  Wt 336 lb (152.409 kg)  BMI 42.58 kg/m2  SpO2 100% Physical Exam  Constitutional: He is oriented to person, place, and time. He appears well-developed and well-nourished.  HENT:  Head: Normocephalic and atraumatic.  Eyes: Conjunctivae are normal.  Neck: Normal range of motion. Neck supple.  Cardiovascular: Normal rate.   Pulmonary/Chest: Effort normal.  No respiratory distress.  Abdominal: Soft. He exhibits no distension. There is no tenderness. There is no rebound and no guarding.  No pelvic tenderness to palpation.  Genitourinary: Penis normal. Circumcised.  Chaperone present: No testicular or penile pain.    Musculoskeletal: Normal range of motion.  Neurological: He is alert and oriented to person, place, and time.  Skin: Skin is warm and dry.  Psychiatric: He has a normal mood and affect. His behavior is normal.  Nursing note and vitals reviewed.   ED Course   Procedures (including critical care time) Labs Review Labs Reviewed  URINALYSIS, ROUTINE W REFLEX MICROSCOPIC (NOT AT [facility]) - Abnormal; Notable for the following:    Specific Gravity, Urine 1.001 (*)    Hgb urine dipstick MODERATE (*)    All other components within normal limits  URINE MICROSCOPIC-ADD ON    Imaging Review No results found. I, Catha Gosselin, personally reviewed and evaluated these images and lab results as part of my medical decision-making.   EKG Interpretation None      MDM   Final diagnoses:  Hematuria   Urinalysis shows moderate hemoglobin but no UTI. He most likely has an acute injury to the urethra due to clamping the penis with this plan. I have given him urology follow-up as well as return precautions. Patient verbally agrees with plan.     Catha Gosselin, PA-C 05/01/15 1502  Jeremy Sou, MD 05/01/15 1504

## 2017-01-27 ENCOUNTER — Encounter (HOSPITAL_COMMUNITY): Payer: Self-pay

## 2017-01-27 ENCOUNTER — Emergency Department (HOSPITAL_COMMUNITY): Payer: Self-pay

## 2017-01-27 ENCOUNTER — Emergency Department (HOSPITAL_COMMUNITY)
Admission: EM | Admit: 2017-01-27 | Discharge: 2017-01-27 | Disposition: A | Discharge status: Home / Self Care | Payer: Self-pay | Attending: Emergency Medicine | Admitting: Emergency Medicine

## 2017-01-27 DIAGNOSIS — S20212A Contusion of left front wall of thorax, initial encounter: Secondary | ICD-10-CM | POA: Insufficient documentation

## 2017-01-27 DIAGNOSIS — W208XXA Other cause of strike by thrown, projected or falling object, initial encounter: Secondary | ICD-10-CM | POA: Insufficient documentation

## 2017-01-27 DIAGNOSIS — F17200 Nicotine dependence, unspecified, uncomplicated: Secondary | ICD-10-CM | POA: Insufficient documentation

## 2017-01-27 DIAGNOSIS — I10 Essential (primary) hypertension: Secondary | ICD-10-CM | POA: Insufficient documentation

## 2017-01-27 DIAGNOSIS — Y929 Unspecified place or not applicable: Secondary | ICD-10-CM | POA: Insufficient documentation

## 2017-01-27 DIAGNOSIS — Y999 Unspecified external cause status: Secondary | ICD-10-CM | POA: Insufficient documentation

## 2017-01-27 DIAGNOSIS — Y9389 Activity, other specified: Secondary | ICD-10-CM | POA: Insufficient documentation

## 2017-01-27 MED ORDER — IBUPROFEN 600 MG PO TABS: 600.0000 mg | ORAL_TABLET | Freq: Four times a day (QID) | ORAL | 0 refills | Status: DC | PRN

## 2017-01-27 MED ORDER — IBUPROFEN 400 MG PO TABS
600.0000 mg | ORAL_TABLET | Freq: Once | ORAL | Status: AC
  Administered 2017-01-27: 600 mg via ORAL
  Filled 2017-01-27: qty 1

## 2017-01-27 NOTE — ED Notes (Addendum)
Pt. Returned from xray 

## 2017-01-27 NOTE — ED Provider Notes (Signed)
[facility]-EMERGENCY DEPT Provider Note   CSN: [id number] Arrival date & time: 01/27/17  1402  By signing my name below, I, Phillips ClimesFabiola de Louis, attest that this documentation has been prepared under the direction and in the presence of Derwood KaplanNanavati, Erwin Nishiyama, MD . Electronically Signed: Phillips ClimesFabiola de Louis, Scribe. 01/27/2017. 5:26 PM.  History   Chief Complaint Chief Complaint  Patient presents with  . Rib Pain    Rib pain   Jeremy Mueller is a 36 y.o. male with a PMHx of HTN, who presents to the Emergency Department with complaints of left chest wall and rib pain x4 hours. Pt was unloading a deep freezer off a truck, utilizing a hand-truck, when it fell and hit him on his left chest. No bleeding or other injuries reported.  Pt denies experiencing any other acute sx, including nausea, vomiting or abdominal pain.  The history is provided by the patient and medical records. No language interpreter was used.   Past Medical History:  Diagnosis Date  . Chronic knee pain   . Hypertension    There are no active problems to display for this patient.  History reviewed. No pertinent surgical history.   Home Medications    Prior to Admission medications   Medication Sig Start Date End Date Taking? Authorizing Provider  AMLODIPINE BESYLATE PO Take 20 mg by mouth.    [provider]  benazepril (LOTENSIN) 20 MG tablet Take 20 mg by mouth daily.      [provider]  HYDROcodone-acetaminophen (NORCO/VICODIN) 5-325 MG per tablet Take 2 tablets by mouth every 4 (four) hours as needed for moderate pain. 04/26/14   Gilda CreasePollina, Christopher J, MD  ibuprofen (ADVIL,MOTRIN) 600 MG tablet Take 1 tablet (600 mg total) by mouth every 6 (six) hours as needed. 01/27/17   Derwood KaplanNanavati, Juliane Guest, MD  LORazepam (ATIVAN) 1 MG tablet Take 1 mg by mouth daily as needed. For anxiety    [provider]  pseudoephedrine (SUDAFED) 30 MG tablet Take 30 mg by mouth once.      [provider]    Pseudoephedrine-APAP-DM (DAYQUIL MULTI-SYMPTO) 40-981-19) 60-650-20 MG/30ML LIQD Take 30 mLs by mouth every 6 (six) hours as needed. For congestion     [provider]  traMADol (ULTRAM) 50 MG tablet Take 1 tablet (50 mg total) by mouth every 6 (six) hours as needed. 04/04/14   Teressa LowerPickering, Vrinda, NP    Family History No family history on file.  Social History Social History  Substance Use Topics  . Smoking status: Current Every Day Smoker    Packs/day: 0.50  . Smokeless tobacco: Never Used  . Alcohol use 1.2 oz/week    2 Cans of beer per week   Allergies   Patient has no known allergies.  Review of Systems Review of Systems  Constitutional: Negative for fever.  Gastrointestinal: Negative for abdominal pain, nausea and vomiting.  Musculoskeletal: Positive for arthralgias.   Physical Exam Updated Vital Signs BP 134/80 (BP Location: Right Arm)   Pulse 87   Temp 99.1 F (37.3 C) (Oral)   Resp 18   Ht 6' 2.5" (1.892 m)   Wt (!) 353 lb (160.1 kg)   SpO2 100%   BMI 44.72 kg/m   Physical Exam  Constitutional: He is oriented to person, place, and time. He appears well-developed and well-nourished. No distress.  HENT:  Head: Normocephalic and atraumatic.  Eyes: Pupils are equal, round, and reactive to light.  Cardiovascular: Normal rate.   Pulmonary/Chest: Effort normal.  Left chest  wall tenderness over the medial midaxillary line. No crepitus or step-offs. No ecchymosis.   Neurological: He is alert and oriented to person, place, and time.  Skin: Skin is warm and dry.  Psychiatric: He has a normal mood and affect.  Nursing note and vitals reviewed.  ED Treatments / Results  DIAGNOSTIC STUDIES: Oxygen Saturation is 100% on RA, nl by my interpretation.  COORDINATION OF CARE: 4:08 PM Discussed treatment plan with pt at bedside and pt agreed to plan.  Labs (all labs ordered are listed, but only abnormal results are displayed) Labs Reviewed - No data to display  EKG   EKG Interpretation None       Radiology Dg Ribs Unilateral W/chest Left  Result Date: 01/27/2017 CLINICAL DATA:  Left-sided chest pain following blunt trauma, initial encounter EXAM: LEFT RIBS AND CHEST - 3+ VIEW COMPARISON:  08/13/2011 FINDINGS: Cardiac shadow is within normal limits. Cardiac shadow is within normal limits. The lungs are well aerated bilaterally. No acute rib fracture is seen. No pneumothorax or pleural effusion is noted. IMPRESSION: No acute abnormality noted. Electronically Signed   By: Alcide Clever M.D.   On: 01/27/2017 17:07    Procedures Procedures (including critical care time)  Medications Ordered in ED Medications  ibuprofen (ADVIL,MOTRIN) tablet 600 mg (600 mg Oral Given 01/27/17 1546)     Initial Impression / Assessment and Plan / ED Course  I have reviewed the triage vital signs and the nursing notes.  Pertinent labs & imaging results that were available during my care of the patient were reviewed by me and considered in my medical decision making (see chart for details).    Pt comes in with cc of chest wall pain that started due to blunt trauma - fridge falling on him. Xrays ordered. If neg, will d/c as contusion.  Final Clinical Impressions(s) / ED Diagnoses   Final diagnoses:  Rib contusion, left, initial encounter    New Prescriptions New Prescriptions   IBUPROFEN (ADVIL,MOTRIN) 600 MG TABLET    Take 1 tablet (600 mg total) by mouth every 6 (six) hours as needed.   I personally performed the services described in this documentation, which was scribed in my presence. The recorded information has been reviewed and is accurate.    Derwood Kaplan, MD 01/27/17 [phone]

## 2017-01-27 NOTE — ED Triage Notes (Signed)
Per Pt, Pt is coming from home with complaints of left rib cage pain after having a deep freezer fall back off of a truck and hit his side. Pt is ambulatory and denies any other injuries.

## 2017-01-27 NOTE — ED Notes (Signed)
Pt. Given ice to put on rib cage

## 2017-01-27 NOTE — Discharge Instructions (Signed)
Xrays show no fracture. Take the meds prescribed.

## 2017-01-27 NOTE — ED Notes (Signed)
EDP at bedside  

## 2017-04-29 ENCOUNTER — Emergency Department (HOSPITAL_COMMUNITY)
Admission: EM | Admit: 2017-04-29 | Discharge: 2017-04-29 | Disposition: A | Discharge status: Home / Self Care | Payer: Self-pay | Attending: Emergency Medicine | Admitting: Emergency Medicine

## 2017-04-29 ENCOUNTER — Encounter (HOSPITAL_COMMUNITY): Payer: Self-pay | Admitting: Emergency Medicine

## 2017-04-29 DIAGNOSIS — Z79899 Other long term (current) drug therapy: Secondary | ICD-10-CM | POA: Insufficient documentation

## 2017-04-29 DIAGNOSIS — I10 Essential (primary) hypertension: Secondary | ICD-10-CM | POA: Insufficient documentation

## 2017-04-29 DIAGNOSIS — R197 Diarrhea, unspecified: Secondary | ICD-10-CM | POA: Insufficient documentation

## 2017-04-29 DIAGNOSIS — R112 Nausea with vomiting, unspecified: Secondary | ICD-10-CM | POA: Insufficient documentation

## 2017-04-29 DIAGNOSIS — Z791 Long term (current) use of non-steroidal anti-inflammatories (NSAID): Secondary | ICD-10-CM | POA: Insufficient documentation

## 2017-04-29 DIAGNOSIS — Z87891 Personal history of nicotine dependence: Secondary | ICD-10-CM | POA: Insufficient documentation

## 2017-04-29 DIAGNOSIS — R1084 Generalized abdominal pain: Secondary | ICD-10-CM | POA: Insufficient documentation

## 2017-04-29 LAB — COMPREHENSIVE METABOLIC PANEL
ALBUMIN: 4.1 g/dL (ref 3.5–5.0)
ALK PHOS: 56 U/L (ref 38–126)
ALT: 32 U/L (ref 17–63)
AST: 29 U/L (ref 15–41)
Anion gap: 7 (ref 5–15)
BILIRUBIN TOTAL: 0.8 mg/dL (ref 0.3–1.2)
BUN: 8 mg/dL (ref 6–20)
CALCIUM: 9.5 mg/dL (ref 8.9–10.3)
CO2: 27 mmol/L (ref 22–32)
Chloride: 104 mmol/L (ref 101–111)
Creatinine, Ser: 1.38 mg/dL — ABNORMAL HIGH (ref 0.61–1.24)
GFR calc Af Amer: >60 mL/min (ref >60)
GLUCOSE: 106 mg/dL — AB (ref 65–99)
POTASSIUM: 4.2 mmol/L (ref 3.5–5.1)
Sodium: 138 mmol/L (ref 135–145)
TOTAL PROTEIN: 8.2 g/dL — AB (ref 6.5–8.1)

## 2017-04-29 LAB — CBC
HEMATOCRIT: 46.4 % (ref 39.0–52.0)
Hemoglobin: 16.7 g/dL (ref 13.0–17.0)
MCH: 31.5 pg (ref 26.0–34.0)
MCHC: 36 g/dL (ref 30.0–36.0)
MCV: 87.5 fL (ref 78.0–100.0)
PLATELETS: 276 10*3/uL (ref 150–400)
RBC: 5.3 MIL/uL (ref 4.22–5.81)
RDW: 12.7 % (ref 11.5–15.5)
WBC: 7.4 10*3/uL (ref 4.0–10.5)

## 2017-04-29 LAB — LIPASE, BLOOD: Lipase: 26 U/L (ref 11–51)

## 2017-04-29 MED ORDER — FAMOTIDINE 20 MG PO TABS
20.0000 mg | ORAL_TABLET | Freq: Once | ORAL | Status: AC
  Administered 2017-04-29: 20 mg via ORAL
  Filled 2017-04-29: qty 1

## 2017-04-29 MED ORDER — ONDANSETRON 4 MG PO TBDP
4.0000 mg | ORAL_TABLET | Freq: Once | ORAL | Status: AC | PRN
  Administered 2017-04-29: 4 mg via ORAL
  Filled 2017-04-29: qty 1

## 2017-04-29 MED ORDER — FAMOTIDINE 20 MG PO TABS: 20.0000 mg | ORAL_TABLET | Freq: Two times a day (BID) | ORAL | 0 refills | Status: DC

## 2017-04-29 MED ORDER — ONDANSETRON 4 MG PO TBDP: 4.0000 mg | ORAL_TABLET | Freq: Three times a day (TID) | ORAL | 0 refills | Status: DC | PRN

## 2017-04-29 NOTE — Discharge Instructions (Signed)
Please read and follow all provided instructions.  Your diagnoses today include:  1. Nausea vomiting and diarrhea     Tests performed today include:  Blood counts and electrolytes  Blood tests to check liver and kidney function  Blood tests to check pancreas function  Vital signs. See below for your results today.   Medications prescribed:   Zofran (ondansetron) - for nausea and vomiting   Pepcid (famotidine) - antihistamine  You can find this medication over-the-counter.   DO NOT exceed:   20mg  Pepcid every 12 hours  Take any prescribed medications only as directed.  Home care instructions:   Follow any educational materials contained in this packet.   Your abdominal pain, nausea, vomiting, and diarrhea may be caused by a viral gastroenteritis also called 'stomach flu'. You should rest for the next several days. Keep drinking plenty of fluids and use the medicine for nausea as directed.    Drink clear liquids for the next 24 hours and introduce solid foods slowly after 24 hours using the b.r.a.t. diet (Bananas, Rice, Applesauce, Toast, Yogurt).    Follow-up instructions: Please follow-up with your primary care provider in the next 2 days for further evaluation of your symptoms. If you are not feeling better in 48 hours you may have a condition that is more serious and you need re-evaluation.   Return instructions:  SEEK IMMEDIATE MEDICAL ATTENTION IF:  If you have pain that does not go away or becomes severe   A temperature above 101F develops   Repeated vomiting occurs (multiple episodes)   If you have pain that becomes localized to portions of the abdomen. The right side could possibly be appendicitis. In an adult, the left lower portion of the abdomen could be colitis or diverticulitis.   Blood is being passed in stools or vomit (bright red or black tarry stools)   You develop chest pain, difficulty breathing, dizziness or fainting, or become confused,  poorly responsive, or inconsolable (young children)  If you have any other emergent concerns regarding your health  Additional Information: Abdominal (belly) pain can be caused by many things. Your caregiver performed an examination and possibly ordered blood/urine tests and imaging (CT scan, x-rays, ultrasound). Many cases can be observed and treated at home after initial evaluation in the emergency department. Even though you are being discharged home, abdominal pain can be unpredictable. Therefore, you need a repeated exam if your pain does not resolve, returns, or worsens. Most patients with abdominal pain don't have to be admitted to the hospital or have surgery, but serious problems like appendicitis and gallbladder attacks can start out as nonspecific pain. Many abdominal conditions cannot be diagnosed in one visit, so follow-up evaluations are very important.  Your vital signs today were: BP (!) 146/101    Pulse 77    Temp 98.3 F (36.8 C) (Oral)    Resp 18    Ht 6\' 2"  (1.88 m)    Wt (!) 154.2 kg (340 lb)    SpO2 97%    BMI 43.65 kg/m  If your blood pressure (bp) was elevated above 135/85 this visit, please have this repeated by your doctor within one month. --------------

## 2017-04-29 NOTE — ED Triage Notes (Signed)
Pt reports n/v/d beginning yesterday, denies fever/chills, reports generalized abd pain. Pt denies emesis today, NAD noted at this time.

## 2017-04-29 NOTE — ED Provider Notes (Signed)
[facility]-EMERGENCY DEPT Provider Note   CSN: [id number] Arrival date & time: 04/29/17  1234     History   Chief Complaint Chief Complaint  Patient presents with  . Emesis    HPI Jeremy Mueller is a 36 y.o. male.  Patient with no past surgical history -- presents with complaint of generalized abdominal pain, multiple episodes of vomiting, one episode of nonbloody soft stool starting last night approximately 9 PM. Patient has had persistent nausea today. He is unable to keep down water. Pain does not radiate. It is described as cramping in nature. No urinary symptoms. Patient denies heavy NSAID use. Patient does drink alcohol on a daily basis. No treatments prior to arrival. The onset of this condition was acute. The course is constant. Aggravating factors: none. Alleviating factors: none.        Past Medical History:  Diagnosis Date  . Chronic knee pain   . Hypertension     There are no active problems to display for this patient.   History reviewed. No pertinent surgical history.     Home Medications    Prior to Admission medications   Medication Sig Start Date End Date Taking? Authorizing Provider  AMLODIPINE BESYLATE PO Take 20 mg by mouth.    [provider]  benazepril (LOTENSIN) 20 MG tablet Take 20 mg by mouth daily.      [provider]  HYDROcodone-acetaminophen (NORCO/VICODIN) 5-325 MG per tablet Take 2 tablets by mouth every 4 (four) hours as needed for moderate pain. 04/26/14   Gilda Crease, MD  ibuprofen (ADVIL,MOTRIN) 600 MG tablet Take 1 tablet (600 mg total) by mouth every 6 (six) hours as needed. 01/27/17   Derwood Kaplan, MD  LORazepam (ATIVAN) 1 MG tablet Take 1 mg by mouth daily as needed. For anxiety    [provider]  pseudoephedrine (SUDAFED) 30 MG tablet Take 30 mg by mouth once.      [provider]  Pseudoephedrine-APAP-DM (DAYQUIL MULTI-SYMPTOM) 46-962-95 MG/30ML LIQD Take 30 mLs by mouth every 6  (six) hours as needed. For congestion     [provider]  traMADol (ULTRAM) 50 MG tablet Take 1 tablet (50 mg total) by mouth every 6 (six) hours as needed. 04/04/14   Teressa Lower, NP    Family History No family history on file.  Social History Social History  Substance Use Topics  . Smoking status: Former Smoker    Packs/day: 0.50  . Smokeless tobacco: Never Used  . Alcohol use 1.2 oz/week    2 Cans of beer per week     Allergies   Patient has no known allergies.   Review of Systems Review of Systems  Constitutional: Negative for fever.  HENT: Negative for rhinorrhea and sore throat.   Eyes: Negative for redness.  Respiratory: Negative for cough.   Cardiovascular: Negative for chest pain.  Gastrointestinal: Positive for abdominal pain, diarrhea, nausea and vomiting. Negative for blood in stool and constipation.  Genitourinary: Negative for dysuria.  Musculoskeletal: Negative for myalgias.  Skin: Negative for rash.  Neurological: Negative for headaches.     Physical Exam Updated Vital Signs BP (!) 159/128 (BP Location: Left Arm)   Pulse 100   Temp 98.3 F (36.8 C) (Oral)   Resp 20   Ht 6\' 2"  (1.88 m)   Wt (!) 154.2 kg (340 lb)   SpO2 97%   BMI 43.65 kg/m   Physical Exam  Constitutional: He appears well-developed and well-nourished.  HENT:  Head:  Normocephalic and atraumatic.  Mouth/Throat: Oropharynx is clear and moist.  Eyes: Conjunctivae are normal. Right eye exhibits no discharge. Left eye exhibits no discharge.  Neck: Normal range of motion. Neck supple.  Cardiovascular: Normal rate, regular rhythm and normal heart sounds.   Pulmonary/Chest: Effort normal and breath sounds normal.  Abdominal: Soft. There is tenderness (Generalized, mild, no signs of peritonitis). There is no rebound and no guarding.  Neurological: He is alert.  Skin: Skin is warm and dry.  Psychiatric: He has a normal mood and affect.  Nursing note and vitals  reviewed.    ED Treatments / Results  Labs (all labs ordered are listed, but only abnormal results are displayed) Labs Reviewed  COMPREHENSIVE METABOLIC PANEL - Abnormal; Notable for the following:       Result Value   Glucose, Bld 106 (*)    Creatinine, Ser 1.38 (*)    Total Protein 8.2 (*)    All other components within normal limits  LIPASE, BLOOD  CBC  URINALYSIS, ROUTINE W REFLEX MICROSCOPIC    Procedures Procedures (including critical care time)  Medications Ordered in ED Medications  ondansetron (ZOFRAN-ODT) disintegrating tablet 4 mg (not administered)  famotidine (PEPCID) tablet 20 mg (not administered)     Initial Impression / Assessment and Plan / ED Course  I have reviewed the triage vital signs and the nursing notes.  Pertinent labs & imaging results that were available during my care of the patient were reviewed by me and considered in my medical decision making (see chart for details).     Patient seen and examined. Work-up initiated. Medications ordered.   Vital signs reviewed and are as follows: BP (!) 159/128 (BP Location: Left Arm)   Pulse 100   Temp 98.3 F (36.8 C) (Oral)   Resp 20   Ht 6\' 2"  (1.88 m)   Wt (!) 154.2 kg (340 lb)   SpO2 97%   BMI 43.65 kg/m   Discussed symptom control with patient. Will give ODT Zofran, oral Pepcid, fluid challenge. If he does well, he'll be discharged home. Patient finds IV fluids at this point. He appears well.  Pt tolerated fluid challenge. D/c home.   The patient was urged to return to the Emergency Department immediately with worsening of current symptoms, worsening abdominal pain, persistent vomiting, blood noted in stools, fever, or any other concerns. The patient verbalized understanding.    Final Clinical Impressions(s) / ED Diagnoses   Final diagnoses:  Nausea vomiting and diarrhea   Patient with symptoms consistent with viral gastroenteritis vs gastritis. Vitals are stable, no fever.  No  signs of dehydration, tolerating PO's. Lungs are clear. No focal abdominal pain. Low concern for appendicitis, cholecystitis, pancreatitis, ruptured viscus, UTI, kidney stone, aortic dissection, aortic aneurysm or other emergent abdominal etiology. Supportive therapy indicated with return if symptoms worsen. Patient counseled.   New Prescriptions Discharge Medication List as of 04/29/2017  4:36 PM    START taking these medications   Details  famotidine (PEPCID) 20 MG tablet Take 1 tablet (20 mg total) by mouth 2 (two) times daily., Starting Tue 04/29/2017, Print    ondansetron (ZOFRAN ODT) 4 MG disintegrating tablet Take 1 tablet (4 mg total) by mouth every 8 (eight) hours as needed for nausea or vomiting., Starting Tue 04/29/2017, Print         Renne CriglerGeiple, Jaikob Borgwardt, PA-C 04/29/17 1750    Pricilla LovelessGoldston, Scott, MD 05/04/17 [phone]

## 2017-04-29 NOTE — ED Notes (Signed)
Pt given sprite 

## 2017-12-08 ENCOUNTER — Encounter (HOSPITAL_BASED_OUTPATIENT_CLINIC_OR_DEPARTMENT_OTHER): Payer: Self-pay | Admitting: *Deleted

## 2017-12-08 ENCOUNTER — Other Ambulatory Visit: Payer: Self-pay

## 2017-12-08 ENCOUNTER — Emergency Department (HOSPITAL_BASED_OUTPATIENT_CLINIC_OR_DEPARTMENT_OTHER)
Admission: EM | Admit: 2017-12-08 | Discharge: 2017-12-08 | Disposition: A | Discharge status: Home / Self Care | Payer: Self-pay | Attending: Emergency Medicine | Admitting: Emergency Medicine

## 2017-12-08 DIAGNOSIS — K625 Hemorrhage of anus and rectum: Secondary | ICD-10-CM | POA: Insufficient documentation

## 2017-12-08 DIAGNOSIS — F1210 Cannabis abuse, uncomplicated: Secondary | ICD-10-CM | POA: Insufficient documentation

## 2017-12-08 DIAGNOSIS — I10 Essential (primary) hypertension: Secondary | ICD-10-CM | POA: Insufficient documentation

## 2017-12-08 DIAGNOSIS — Z87891 Personal history of nicotine dependence: Secondary | ICD-10-CM | POA: Insufficient documentation

## 2017-12-08 DIAGNOSIS — K529 Noninfective gastroenteritis and colitis, unspecified: Secondary | ICD-10-CM | POA: Insufficient documentation

## 2017-12-08 DIAGNOSIS — Z79899 Other long term (current) drug therapy: Secondary | ICD-10-CM | POA: Insufficient documentation

## 2017-12-08 LAB — COMPREHENSIVE METABOLIC PANEL
ALBUMIN: 4.3 g/dL (ref 3.5–5.0)
ALK PHOS: 63 U/L (ref 38–126)
ALT: 38 U/L (ref 17–63)
ANION GAP: 10 (ref 5–15)
AST: 32 U/L (ref 15–41)
BILIRUBIN TOTAL: 0.6 mg/dL (ref 0.3–1.2)
BUN: 10 mg/dL (ref 6–20)
CALCIUM: 9.5 mg/dL (ref 8.9–10.3)
CO2: 23 mmol/L (ref 22–32)
Chloride: 103 mmol/L (ref 101–111)
Creatinine, Ser: 1.16 mg/dL (ref 0.61–1.24)
GFR calc Af Amer: >60 mL/min (ref >60)
GFR calc non Af Amer: >60 mL/min (ref >60)
GLUCOSE: 157 mg/dL — AB (ref 65–99)
Potassium: 3.8 mmol/L (ref 3.5–5.1)
SODIUM: 136 mmol/L (ref 135–145)
TOTAL PROTEIN: 8.3 g/dL — AB (ref 6.5–8.1)

## 2017-12-08 LAB — CBC
HEMATOCRIT: 44.6 % (ref 39.0–52.0)
HEMOGLOBIN: 15.9 g/dL (ref 13.0–17.0)
MCH: 31 pg (ref 26.0–34.0)
MCHC: 35.7 g/dL (ref 30.0–36.0)
MCV: 86.9 fL (ref 78.0–100.0)
Platelets: 275 10*3/uL (ref 150–400)
RBC: 5.13 MIL/uL (ref 4.22–5.81)
RDW: 12.6 % (ref 11.5–15.5)
WBC: 7.7 10*3/uL (ref 4.0–10.5)

## 2017-12-08 LAB — URINALYSIS, MICROSCOPIC (REFLEX)

## 2017-12-08 LAB — LIPASE, BLOOD: Lipase: 27 U/L (ref 11–51)

## 2017-12-08 LAB — URINALYSIS, ROUTINE W REFLEX MICROSCOPIC
Bilirubin Urine: NEGATIVE
GLUCOSE, UA: NEGATIVE mg/dL
KETONES UR: NEGATIVE mg/dL
LEUKOCYTES UA: NEGATIVE
NITRITE: NEGATIVE
PH: 8 (ref 5.0–8.0)
Protein, ur: 30 mg/dL — AB
SPECIFIC GRAVITY, URINE: 1.015 (ref 1.005–1.030)

## 2017-12-08 MED ORDER — CIPROFLOXACIN HCL 500 MG PO TABS: 500.0000 mg | ORAL_TABLET | Freq: Two times a day (BID) | ORAL | 0 refills | Status: AC

## 2017-12-08 MED ORDER — METRONIDAZOLE 500 MG PO TABS: 500.0000 mg | ORAL_TABLET | Freq: Three times a day (TID) | ORAL | 0 refills | Status: AC

## 2017-12-08 NOTE — ED Provider Notes (Signed)
[facility] EMERGENCY DEPARTMENT Provider Note   CSN: [id number] Arrival date & time: 12/08/17  1141     History   Chief Complaint Chief Complaint  Patient presents with  . Abdominal Pain  . Rectal Bleeding    HPI Jeremy Mueller is a 37 y.o. male.  Complaint is blood in my stool.  HPI: Jeremy Mueller is a 37 year old male.  He has some intestinal cramping a few days ago.  2 nights ago he had some blood in his stool.  This was mixed with soft stool and mucus.  Yesterday there was less.  Today there was less but it persists.  This is not watery.  This is markedly soft stool with blood.  Not blood on the outside of a formed stool.  He has no perirectal pain or pain with bowel movements.  He states he had no pain today.  Some cramping yesterday.  No fevers, no nausea or vomiting.  History of previous bleeding.  No history of hemorrhoids.  No history of inflammatory bowel disease.  Past Medical History:  Diagnosis Date  . Chronic knee pain   . Hypertension     There are no active problems to display for this patient.   History reviewed. No pertinent surgical history.      Home Medications    Prior to Admission medications   Medication Sig Start Date End Date Taking? Authorizing Provider  AMLODIPINE BESYLATE PO Take 20 mg by mouth.    [provider]  benazepril (LOTENSIN) 20 MG tablet Take 20 mg by mouth daily.      [provider]  ciprofloxacin (CIPRO) 500 MG tablet Take 1 tablet (500 mg total) by mouth every 12 (twelve) hours for 7 days. 12/08/17 12/15/17  Rolland PorterJames, Everlee Quakenbush, MD  famotidine (PEPCID) 20 MG tablet Take 1 tablet (20 mg total) by mouth 2 (two) times daily. 04/29/17   Renne CriglerGeiple, Joshua, PA-C  HYDROcodone-acetaminophen (NORCO/VICODIN) 5-325 MG per tablet Take 2 tablets by mouth every 4 (four) hours as needed for moderate pain. 04/26/14   Gilda CreasePollina, Christopher J, MD  ibuprofen (ADVIL,MOTRIN) 600 MG tablet Take 1 tablet (600 mg total) by mouth every 6 (six)  hours as needed. 01/27/17   Derwood KaplanNanavati, Ankit, MD  LORazepam (ATIVAN) 1 MG tablet Take 1 mg by mouth daily as needed. For anxiety    [provider]  metroNIDAZOLE (FLAGYL) 500 MG tablet Take 1 tablet (500 mg total) by mouth 3 (three) times daily for 7 days. 12/08/17 12/15/17  Rolland PorterJames, Lillian Ballester, MD  ondansetron (ZOFRAN ODT) 4 MG disintegrating tablet Take 1 tablet (4 mg total) by mouth every 8 (eight) hours as needed for nausea or vomiting. 04/29/17   Renne CriglerGeiple, Joshua, PA-C  pseudoephedrine (SUDAFED) 30 MG tablet Take 30 mg by mouth once.      [provider]  Pseudoephedrine-APAP-DM (DAYQUIL MULTI-SYMPTO) 78-295-62) 60-650-20 MG/30ML LIQD Take 30 mLs by mouth every 6 (six) hours as needed. For congestion     [provider]  traMADol (ULTRAM) 50 MG tablet Take 1 tablet (50 mg total) by mouth every 6 (six) hours as needed. 04/04/14   Teressa LowerPickering, Vrinda, NP    Family History No family history on file.  Social History Social History   Tobacco Use  . Smoking status: Former Smoker    Packs/day: 0.50  . Smokeless tobacco: Never Used  Substance Use Topics  . Alcohol use: Yes    Alcohol/week: 1.2 oz    Types: 2 Cans of beer per week  .  Drug use: Yes    Types: Marijuana     Allergies   Patient has no known allergies.   Review of Systems Review of Systems  Constitutional: Negative for appetite change, chills, diaphoresis, fatigue and fever.  HENT: Negative for mouth sores, sore throat and trouble swallowing.   Eyes: Negative for visual disturbance.  Respiratory: Negative for cough, chest tightness, shortness of breath and wheezing.   Cardiovascular: Negative for chest pain.  Gastrointestinal: Positive for abdominal pain and blood in stool. Negative for abdominal distention, diarrhea, nausea and vomiting.  Endocrine: Negative for polydipsia, polyphagia and polyuria.  Genitourinary: Negative for dysuria, frequency and hematuria.  Musculoskeletal: Negative for gait problem.  Skin:  Negative for color change, pallor and rash.  Neurological: Negative for dizziness, syncope, light-headedness and headaches.  Hematological: Does not bruise/bleed easily.  Psychiatric/Behavioral: Negative for behavioral problems and confusion.     Physical Exam Updated Vital Signs BP (!) 176/110 (BP Location: Right Arm)   Pulse (!) 56   Temp 98.2 F (36.8 C) (Oral)   Resp 18   Ht 6\' 2"  (1.88 m)   SpO2 100%   BMI 43.65 kg/m   Physical Exam  Constitutional: He appears well-developed and well-nourished.  HENT:  Head: Normocephalic and atraumatic.  Eyes: Conjunctivae are normal.  Neck: Neck supple.  Cardiovascular: Normal rate and regular rhythm.  No murmur heard. Pulmonary/Chest: Effort normal and breath sounds normal. No respiratory distress.  Abdominal: Soft. There is no tenderness.  , Completely benign abdomen.  No focal tenderness.  No pain currently.  Perirectal exam shows no fissure or hemorrhoid.  Rectal exam shows trace of blood with stool.  No palpable internal mass to suggest hemorrhoid.  Musculoskeletal: He exhibits no edema.  Neurological: He is alert.  Skin: Skin is warm and dry.  Psychiatric: He has a normal mood and affect.  Nursing note and vitals reviewed.    ED Treatments / Results  Labs (all labs ordered are listed, but only abnormal results are displayed) Labs Reviewed  URINALYSIS, ROUTINE W REFLEX MICROSCOPIC - Abnormal; Notable for the following components:      Result Value   Hgb urine dipstick TRACE (*)    Protein, ur 30 (*)    All other components within normal limits  COMPREHENSIVE METABOLIC PANEL - Abnormal; Notable for the following components:   Glucose, Bld 157 (*)    Total Protein 8.3 (*)    All other components within normal limits  URINALYSIS, MICROSCOPIC (REFLEX) - Abnormal; Notable for the following components:   Bacteria, UA RARE (*)    Squamous Epithelial / LPF 0-5 (*)    All other components within normal limits  LIPASE, BLOOD    CBC    EKG None  Radiology No results found.  Procedures Procedures (including critical care time)  Medications Ordered in ED Medications - No data to display   Initial Impression / Assessment and Plan / ED Course  I have reviewed the triage vital signs and the nursing notes.  Pertinent labs & imaging results that were available during my care of the patient were reviewed by me and considered in my medical decision making (see chart for details).    And labs.  At this point I am not concerned that this is a marked inflammatory bowel disorder or localized infection such as a diverticulitis.  This may be a bacterial colitis and his symptoms are improving we discussed risks and benefits of CT scanning.  At this point I did do  not feel this would change his ultimate treatment strategy.  Plan will be treatment with Cipro Flagyl.  He was comfortable with with declining CT scan at this point.  I discussed with him that if his bleeding worsens or if he feels worse in any way he should recheck here.  This includes fever, recurrence of pain.  Lightheadedness, more blood.  I discussed with him that any patient in ICU with GI bleeding I recommend follow-up to ensure that the blood has resolved.  We discussed that GI bleeding can be a sign of colon cancer.  I have given him GI for follow-up.  Of asked him to follow-up with either gastroenterologist or his primary care physician for recheck rectal guaiac testing.  Final Clinical Impressions(s) / ED Diagnoses   Final diagnoses:  Colitis    ED Discharge Orders        Ordered    ciprofloxacin (CIPRO) 500 MG tablet  Every 12 hours     12/08/17 1648    metroNIDAZOLE (FLAGYL) 500 MG tablet  3 times daily     12/08/17 1648       Rolland Porter, MD 12/08/17 1652

## 2017-12-08 NOTE — ED Notes (Signed)
NAD at this time. Pt is stable and going home.  

## 2017-12-08 NOTE — Discharge Instructions (Addendum)
Being treated with antibiotics for probable bacterial infection of the intestine called "colitis".  The amount of blood you are seeing in your stool should slowly improve over the next 2-3 days.  Any patient with blood in stools should have a follow-up appointment with care physician, or GI physician to the bleeding has resolved.  Although it is less likely than infection in your case, possibility of colon cancer always exists.  Please be sure that you follow-up to ensure that the blood has resolved

## 2017-12-08 NOTE — ED Triage Notes (Signed)
abdominal pain and bright red blood in his stool x 2 days.

## 2018-03-12 ENCOUNTER — Other Ambulatory Visit: Payer: Self-pay

## 2018-03-12 ENCOUNTER — Emergency Department (HOSPITAL_BASED_OUTPATIENT_CLINIC_OR_DEPARTMENT_OTHER)
Admission: EM | Admit: 2018-03-12 | Discharge: 2018-03-12 | Disposition: A | Discharge status: Home / Self Care | Payer: Medicaid Other | Attending: Emergency Medicine | Admitting: Emergency Medicine

## 2018-03-12 ENCOUNTER — Encounter (HOSPITAL_BASED_OUTPATIENT_CLINIC_OR_DEPARTMENT_OTHER): Payer: Self-pay | Admitting: Emergency Medicine

## 2018-03-12 DIAGNOSIS — R739 Hyperglycemia, unspecified: Secondary | ICD-10-CM | POA: Insufficient documentation

## 2018-03-12 DIAGNOSIS — I10 Essential (primary) hypertension: Secondary | ICD-10-CM | POA: Insufficient documentation

## 2018-03-12 DIAGNOSIS — Z87891 Personal history of nicotine dependence: Secondary | ICD-10-CM | POA: Insufficient documentation

## 2018-03-12 DIAGNOSIS — G542 Cervical root disorders, not elsewhere classified: Secondary | ICD-10-CM

## 2018-03-12 DIAGNOSIS — R0789 Other chest pain: Secondary | ICD-10-CM | POA: Insufficient documentation

## 2018-03-12 DIAGNOSIS — R200 Anesthesia of skin: Secondary | ICD-10-CM | POA: Insufficient documentation

## 2018-03-12 LAB — BASIC METABOLIC PANEL
Anion gap: 11 (ref 5–15)
BUN: 12 mg/dL (ref 6–20)
CHLORIDE: 96 mmol/L — AB (ref 98–111)
CO2: 25 mmol/L (ref 22–32)
CREATININE: 1.23 mg/dL (ref 0.61–1.24)
Calcium: 9.2 mg/dL (ref 8.9–10.3)
GFR calc non Af Amer: >60 mL/min (ref >60)
Glucose, Bld: 354 mg/dL — ABNORMAL HIGH (ref 70–99)
POTASSIUM: 3.9 mmol/L (ref 3.5–5.1)
Sodium: 132 mmol/L — ABNORMAL LOW (ref 135–145)

## 2018-03-12 LAB — TROPONIN I
Troponin I: 0.03 ng/mL (ref <0.03)
Troponin I: <0.03 ng/mL (ref <0.03)

## 2018-03-12 LAB — CBC WITH DIFFERENTIAL/PLATELET
BASOS PCT: 0 %
Basophils Absolute: 0 10*3/uL (ref 0.0–0.1)
EOS PCT: 3 %
Eosinophils Absolute: 0.2 10*3/uL (ref 0.0–0.7)
HCT: 45.7 % (ref 39.0–52.0)
HEMOGLOBIN: 17.1 g/dL — AB (ref 13.0–17.0)
LYMPHS PCT: 44 %
Lymphs Abs: 3.7 10*3/uL (ref 0.7–4.0)
MCH: 31.8 pg (ref 26.0–34.0)
MCHC: 37.4 g/dL — ABNORMAL HIGH (ref 30.0–36.0)
MCV: 85.1 fL (ref 78.0–100.0)
MONO ABS: 0.8 10*3/uL (ref 0.1–1.0)
MONOS PCT: 10 %
NEUTROS PCT: 43 %
Neutro Abs: 3.5 10*3/uL (ref 1.7–7.7)
Platelets: 265 10*3/uL (ref 150–400)
RBC: 5.37 MIL/uL (ref 4.22–5.81)
RDW: 12.6 % (ref 11.5–15.5)
WBC: 8.2 10*3/uL (ref 4.0–10.5)

## 2018-03-12 MED ORDER — METFORMIN HCL 500 MG PO TABS: 500.0000 mg | ORAL_TABLET | Freq: Two times a day (BID) | ORAL | 0 refills | Status: AC

## 2018-03-12 MED ORDER — AMLODIPINE BESYLATE 5 MG PO TABS: 5.0000 mg | ORAL_TABLET | Freq: Every day | ORAL | 0 refills | Status: AC

## 2018-03-12 NOTE — Care Management Note (Signed)
Case Management Note  CM consulted for no pcp.  CM spoke with Dr. Clarene DukeLittle to advise pt look on his Medicaid Card for his PCP office.  If there it not one listed, to go to DSS and ask for one to be assigned or get a list of Medicaid providers for him to call.  Information placed on AVS.  No futher CM needs noted at this time.  Astella Desir, Lynnae SandhoffAngela N, RN 03/12/2018, 9:36 AM

## 2018-03-12 NOTE — ED Provider Notes (Signed)
I received this patient in signout from Dr. Rhunette CroftNanavati. We were awaiting second troponin, which was negative. Pt well appearing on reassessment. Because of hyperglycemia and HTN, charted patient on amlodipine and metformin and emphasized the importance of establishing care with a PCP.  Called case manager; because the patient has Medicaid, he can identify PCP from his Medicaid card or by calling the office where he originally applied for Medicaid.  I discussed with this plan with the patient.  Return precautions reviewed and he voiced understanding.   Jeremy Mueller, Ambrose Finlandachel Morgan, MD 03/12/18 [phone]

## 2018-03-12 NOTE — ED Provider Notes (Signed)
[facility] EMERGENCY DEPARTMENT Provider Note   CSN: [id number] Arrival date & time: 03/12/18  4098     History   Chief Complaint Chief Complaint  Patient presents with  . Torticollis    HPI Jeremy Mueller is a 37 y.o. male.  HPI  37 year old male comes in with chief complaint of numbness in his left fingers, neck pain and chest tightness.  Patient has history of hypertension that is not controlled.  Patient states that after having intercourse he started having numbness in his fourth and fifth digit, and numbness in the back of his arm.  Symptoms are constant, therefore patient decided to come to the ER.  In route patient did have an episode of chest tightness and shortness of breath, chest tightness was midsternal and nonradiating.  At the moment patient does not have any symptoms.  Patient denies any family history of premature CAD, patient is a smoker and denies any history of cocaine use.  Patient has not taken blood pressure medicines for over 3 years.  Past Medical History:  Diagnosis Date  . Chronic knee pain   . Hypertension     There are no active problems to display for this patient.   History reviewed. No pertinent surgical history.      Home Medications    Prior to Admission medications   Not on File    Family History No family history on file.  Social History Social History   Tobacco Use  . Smoking status: Former Smoker    Packs/day: 0.50  . Smokeless tobacco: Never Used  Substance Use Topics  . Alcohol use: Yes    Alcohol/week: 1.2 oz    Types: 2 Cans of beer per week  . Drug use: Yes    Types: Marijuana     Allergies   Patient has no known allergies.   Review of Systems Review of Systems  Constitutional: Positive for activity change. Negative for diaphoresis.  Respiratory: Positive for chest tightness and shortness of breath.   Cardiovascular: Positive for chest pain.  Gastrointestinal: Negative for nausea.    Neurological: Positive for numbness.  Hematological: Does not bruise/bleed easily.  All other systems reviewed and are negative.    Physical Exam Updated Vital Signs BP (!) 162/118 (BP Location: Left Arm)   Pulse 100   Temp 98.3 F (36.8 C) (Oral)   Resp 20   Ht 6\' 2"  (1.88 m)   Wt (!) 162.3 kg (357 lb 11.2 oz)   SpO2 99%   BMI 45.93 kg/m   Physical Exam  Constitutional: He is oriented to person, place, and time. He appears well-developed.  HENT:  Head: Atraumatic.  Eyes: Pupils are equal, round, and reactive to light. EOM are normal.  Neck: Neck supple.  No midline c-spine tenderness, pt able to turn head to 45 degrees bilaterally without any pain and able to flex neck to the chest and extend without any pain or neurologic symptoms.  Positive paraspinal tenderness over the left side, along with tenderness over the left deltoid  Cardiovascular: Normal rate and intact distal pulses.  Pulmonary/Chest: Effort normal.  Neurological: He is alert and oriented to person, place, and time. No cranial nerve deficit. Coordination normal.  Numbness over the digits is reproduced when we have patient turning his head to the left side.  Skin: Skin is warm.  Nursing note and vitals reviewed.    ED Treatments / Results  Labs (all labs ordered are listed, but only abnormal results are  displayed) Labs Reviewed  BASIC METABOLIC PANEL  CBC WITH DIFFERENTIAL/PLATELET  TROPONIN I    EKG EKG Interpretation  Date/Time:  Thursday March 12 2018 06:42:31 EDT Ventricular Rate:  90 PR Interval:    QRS Duration: 101 QT Interval:  364 QTC Calculation: 446 R Axis:   -38 Text Interpretation:  Sinus rhythm Incomplete RBBB and LAFB RSR' in V1 or V2, right VCD or RVH Left ventricular hypertrophy No significant change since last tracing Confirmed by Derwood KaplanNanavati, Almir Botts [phone]([id number]) on 03/12/2018 6:47:55 AM   Radiology No results found.  Procedures Procedures (including critical care  time)  Medications Ordered in ED Medications - No data to display   Initial Impression / Assessment and Plan / ED Course  I have reviewed the triage vital signs and the nursing notes.  Pertinent labs & imaging results that were available during my care of the patient were reviewed by me and considered in my medical decision making (see chart for details).     37 year old male comes in with chief complaint of left-sided numbness, chest tightness and shortness of breath.  It appears that patient symptoms started with numbness of his fourth and fifth digit, along with the posterior aspect of his arm.  In route, patient started having some chest tightness and shortness of breath.  On our exam patient does not have any midline C-spine tenderness, however his numbness is reproduced when we have him return to the left side.  I suspect that the patient's tingling is because of cervical nerve root impingement.  Patient had nonspecific chest tightness and shortness of breath when he was driving to the ER, which has now resolved.  EKG has no acute findings, however we do see evidence of LVH.  Which brings us to the next point, patient has not been taking his blood pressure medications for the last several years.  Our records show that he was taking amlodipine 20 mg at one point.  Patient states that he stopped taking medications because he lost weight and his blood pressure improved, however over the past 1 year he has regained some of the weight.  We will start him on amlodipine and hydrochlorothiazide.  We will advised patient follow-up with primary care doctor for optimization.  Plan is to get basic labs along with single troponin value. Pt doesn't want to stay for delta troponin. We explained to him the improved sensitivity in ruling out ACS with delta trop, but pt rather leave. My suspicion for ACS is extremely low, so I am comfortable with the plan.  Final Clinical Impressions(s) / ED Diagnoses    Final diagnoses:  Cervical nerve root impingement  Chest tightness  Essential hypertension    ED Discharge Orders    None       Derwood KaplanNanavati, Mace Weinberg, MD 03/12/18 [phone]

## 2018-03-12 NOTE — ED Triage Notes (Signed)
Pt reports sudden on set of pain posterior neck with tingling in left arm while laying in bed x 30 mins PTA

## 2018-03-12 NOTE — ED Notes (Signed)
Pt states he was formerly on BP medication but lost weight and didn't need medication. Pt states he then gained weight and has not followed with PCP about BP. Pt not currently taking any medications.

## 2018-03-12 NOTE — ED Notes (Signed)
Date and time results received: 03/12/18 0729   Test: trp Critical Value: 0.03  Name of Provider Notified: Nanavati Orders Received? Or Actions Taken?: no orders given

## 2018-03-12 NOTE — Discharge Instructions (Addendum)
We saw you in the ER for your tingling sensation. We suspect that your symptoms are because of impingement syndrome.  Please take the medications prescribed, see the neurologist if your symptoms continue.  You were also noted to have elevated blood pressure, please start taking the blood pressure medication as prescribed and see a primary care doctor for optimal management of your blood pressure.

## 2019-07-01 ENCOUNTER — Other Ambulatory Visit: Payer: Self-pay

## 2019-07-01 ENCOUNTER — Encounter (HOSPITAL_COMMUNITY): Payer: Self-pay

## 2019-07-01 ENCOUNTER — Emergency Department (HOSPITAL_COMMUNITY): Payer: 59

## 2019-07-01 ENCOUNTER — Emergency Department (HOSPITAL_COMMUNITY)
Admission: EM | Admit: 2019-07-01 | Discharge: 2019-07-01 | Disposition: A | Discharge status: Home / Self Care | Payer: 59 | Attending: Emergency Medicine | Admitting: Emergency Medicine

## 2019-07-01 DIAGNOSIS — W2209XA Striking against other stationary object, initial encounter: Secondary | ICD-10-CM | POA: Diagnosis not present

## 2019-07-01 DIAGNOSIS — Z79899 Other long term (current) drug therapy: Secondary | ICD-10-CM | POA: Insufficient documentation

## 2019-07-01 DIAGNOSIS — E119 Type 2 diabetes mellitus without complications: Secondary | ICD-10-CM | POA: Diagnosis not present

## 2019-07-01 DIAGNOSIS — Y939 Activity, unspecified: Secondary | ICD-10-CM | POA: Diagnosis not present

## 2019-07-01 DIAGNOSIS — Z7984 Long term (current) use of oral hypoglycemic drugs: Secondary | ICD-10-CM | POA: Diagnosis not present

## 2019-07-01 DIAGNOSIS — F410 Panic disorder [episodic paroxysmal anxiety] without agoraphobia: Secondary | ICD-10-CM | POA: Insufficient documentation

## 2019-07-01 DIAGNOSIS — Y999 Unspecified external cause status: Secondary | ICD-10-CM | POA: Diagnosis not present

## 2019-07-01 DIAGNOSIS — S62339A Displaced fracture of neck of unspecified metacarpal bone, initial encounter for closed fracture: Secondary | ICD-10-CM | POA: Diagnosis not present

## 2019-07-01 DIAGNOSIS — F1210 Cannabis abuse, uncomplicated: Secondary | ICD-10-CM | POA: Diagnosis not present

## 2019-07-01 DIAGNOSIS — Y929 Unspecified place or not applicable: Secondary | ICD-10-CM | POA: Diagnosis not present

## 2019-07-01 DIAGNOSIS — I10 Essential (primary) hypertension: Secondary | ICD-10-CM | POA: Insufficient documentation

## 2019-07-01 DIAGNOSIS — Z87891 Personal history of nicotine dependence: Secondary | ICD-10-CM | POA: Insufficient documentation

## 2019-07-01 DIAGNOSIS — F419 Anxiety disorder, unspecified: Secondary | ICD-10-CM | POA: Diagnosis present

## 2019-07-01 HISTORY — DX: Type 2 diabetes mellitus without complications: E11.9

## 2019-07-01 MED ORDER — HYDROXYZINE HCL 25 MG PO TABS: 25.0000 mg | ORAL_TABLET | Freq: Four times a day (QID) | ORAL | 0 refills | Status: DC

## 2019-07-01 MED ORDER — HYDROXYZINE HCL 25 MG PO TABS: 25.0000 mg | ORAL_TABLET | Freq: Four times a day (QID) | ORAL | 0 refills | Status: AC

## 2019-07-01 NOTE — Discharge Instructions (Addendum)
Please take Ibuprofen or Tylenol for pain Follow up with Dr. Fredna Dow for you hand Rest and elevate the hand to help with swelling Follow up with your doctor regarding anxiety. You can try Hydroxyzine as needed for anxiety.
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
## 2019-07-01 NOTE — ED Notes (Signed)
Patient verbalizes understanding of discharge instructions. Opportunity for questioning and answers were provided. Armband removed by staff, pt discharged from ED ambulatory by self\  
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
## 2019-07-01 NOTE — Progress Notes (Signed)
Orthopedic Tech Progress Note Patient Details:  Jeremy Mueller 1981/09/12 [id number]  Ortho Devices Type of Ortho Device: Ulna gutter splint Ortho Device/Splint Location: ULE Ortho Device/Splint Interventions: Adjustment, Ordered, Application   Post Interventions Patient Tolerated: Well Instructions Provided: Care of device, Adjustment of device   Janit Pagan 07/01/2019, 3:35 PM

## 2020-12-13 IMAGING — DX DG HAND COMPLETE 3+V*L*
3 series · 3 of 3 positions shown · non-contrast
Comparison: None.

CLINICAL DATA: Pain after hitting cabinet

EXAM:
LEFT HAND - COMPLETE 3+ VIEW

[hand pa]
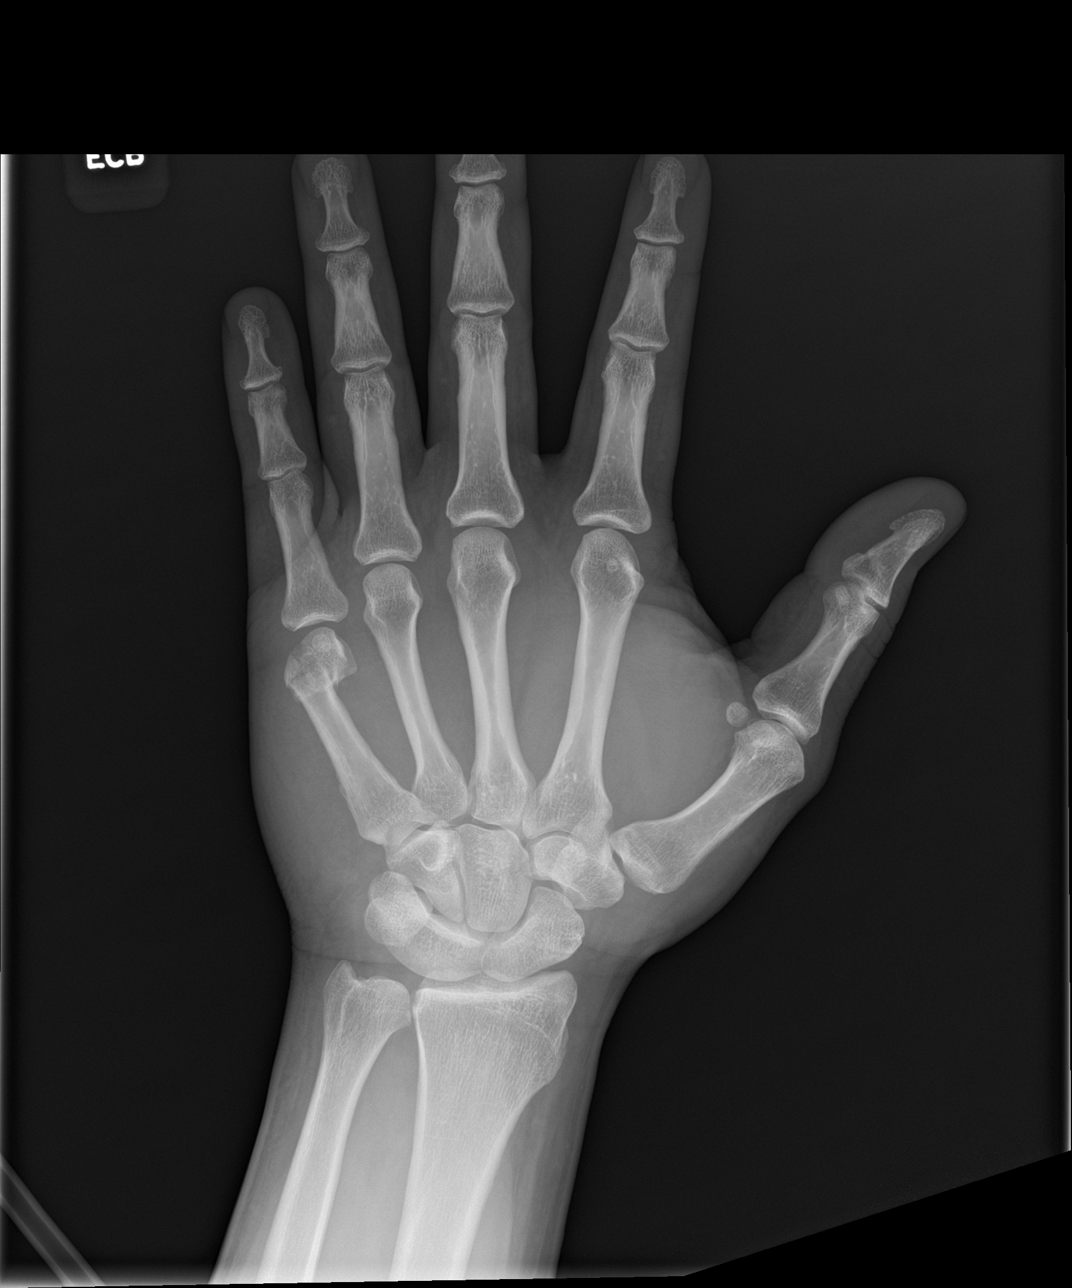

[hand obl]
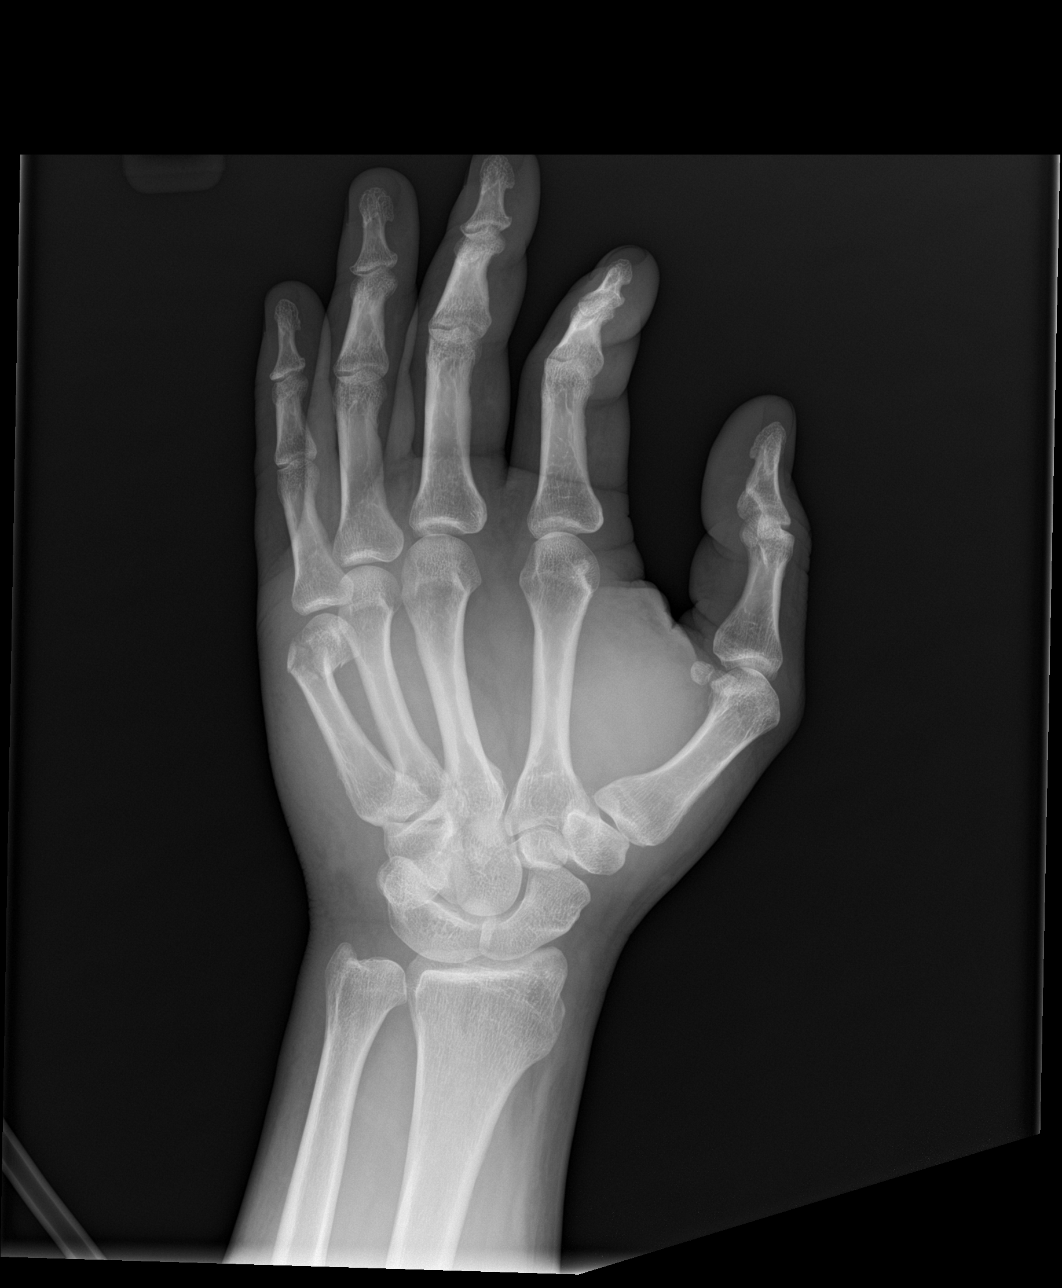

[hand lat]
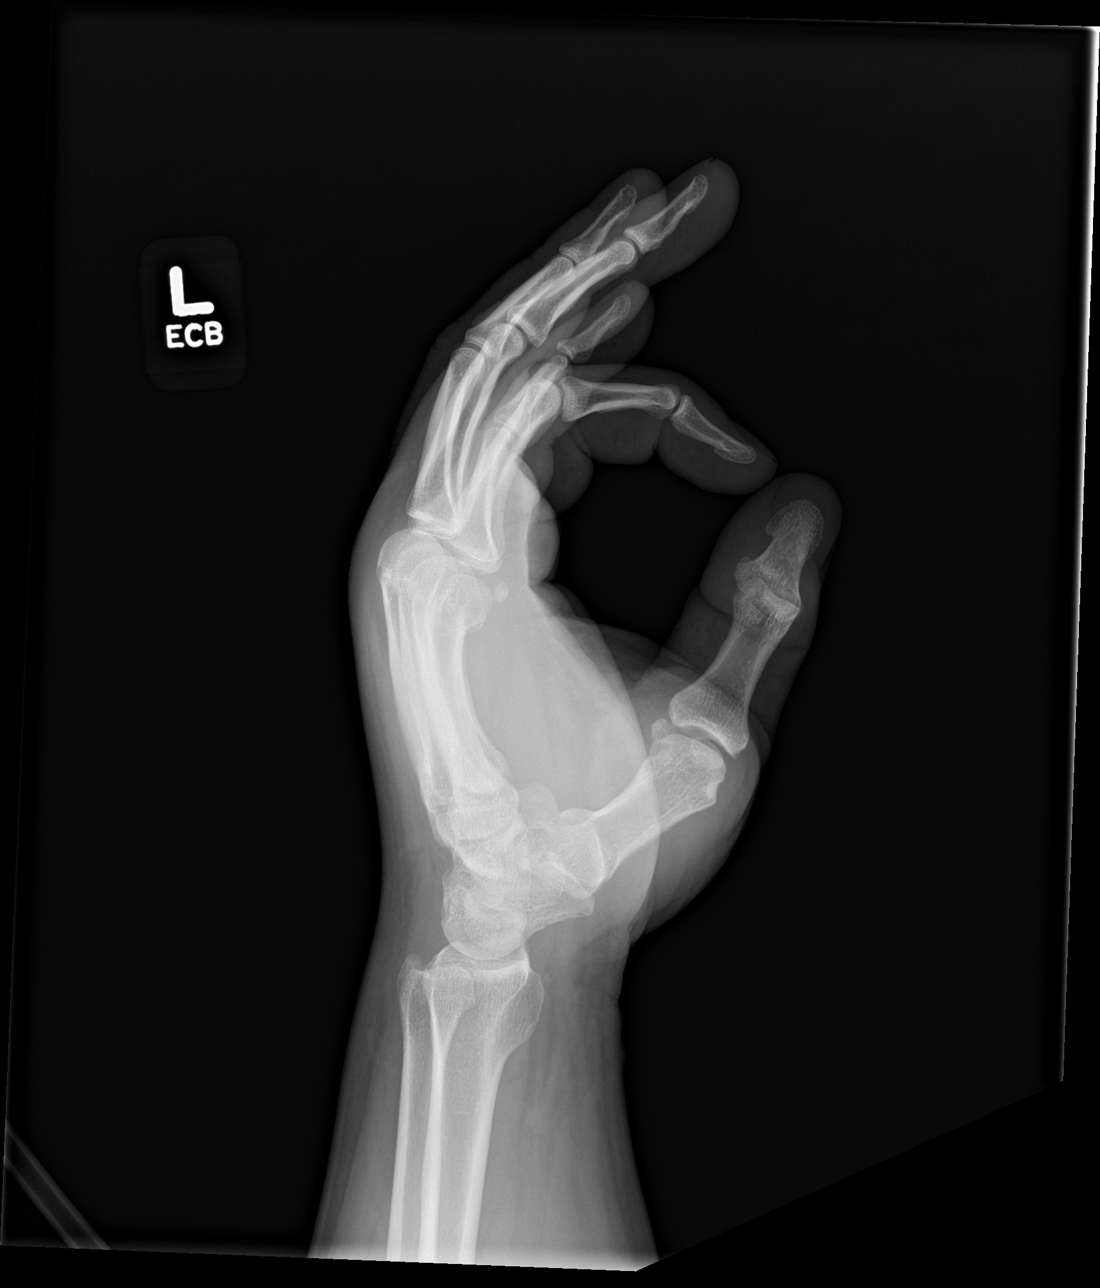

[3 of 3 positions shown; findings below may reference images not displayed]

FINDINGS: Frontal, oblique, and lateral views were obtained. There is a
comminuted fracture of the distal fifth metacarpal with volar
angulation distally. No other fractures are evident. No dislocation.
Joint spaces appear normal. No erosive change. There is congenital
fusion of the lunate and triquetrum, an anatomic variant.
IMPRESSION: Comminuted fracture distal fifth metacarpal with volar angulation
distally. No other fractures. No dislocation. Joint spaces appear
normal. There is congenital fusion of the lunate and triquetrum, an
anatomic variant.

## 2022-01-05 ENCOUNTER — Emergency Department (HOSPITAL_BASED_OUTPATIENT_CLINIC_OR_DEPARTMENT_OTHER)
Admission: EM | Admit: 2022-01-05 | Discharge: 2022-01-05 | Disposition: A | Discharge status: Home / Self Care | Payer: BC Managed Care – PPO | Attending: Emergency Medicine | Admitting: Emergency Medicine

## 2022-01-05 ENCOUNTER — Other Ambulatory Visit: Payer: Self-pay

## 2022-01-05 ENCOUNTER — Encounter (HOSPITAL_BASED_OUTPATIENT_CLINIC_OR_DEPARTMENT_OTHER): Payer: Self-pay | Admitting: Emergency Medicine

## 2022-01-05 DIAGNOSIS — S60451A Superficial foreign body of left index finger, initial encounter: Secondary | ICD-10-CM | POA: Diagnosis not present

## 2022-01-05 DIAGNOSIS — W458XXA Other foreign body or object entering through skin, initial encounter: Secondary | ICD-10-CM | POA: Insufficient documentation

## 2022-01-05 DIAGNOSIS — Z23 Encounter for immunization: Secondary | ICD-10-CM | POA: Insufficient documentation

## 2022-01-05 DIAGNOSIS — S60459A Superficial foreign body of unspecified finger, initial encounter: Secondary | ICD-10-CM

## 2022-01-05 MED ORDER — LIDOCAINE HCL (PF) 1 % IJ SOLN
10.0000 mL | Freq: Once | INTRAMUSCULAR | Status: AC
  Administered 2022-01-05: 10 mL
  Filled 2022-01-05: qty 10

## 2022-01-05 MED ORDER — TETANUS-DIPHTH-ACELL PERTUSSIS 5-2.5-18.5 LF-MCG/0.5 IM SUSY
0.5000 mL | PREFILLED_SYRINGE | Freq: Once | INTRAMUSCULAR | Status: AC
  Administered 2022-01-05: 0.5 mL via INTRAMUSCULAR
  Filled 2022-01-05: qty 0.5

## 2022-01-05 NOTE — ED Provider Notes (Signed)
?[facility] EMERGENCY DEPARTMENT ?Provider Note ? ? ?CSN: [id number] ?Arrival date & time: 01/05/22  1659 ? ?  ? ?History ? ?Chief Complaint  ?Patient presents with  ? Finger Injury  ? ? ?Jeremy Mueller is a 41 y.o. male. ? ?Patient with no pertinent past medical history presents today with complaints of splinter under his fingernail.  He states that a few days ago he reached on the ground to pick something up and felt a foreign body in turbinates his left index finger.  He states he attempted to pull it out and it subsequently broke.  States that he has been attempting to cut the nail and remove the foreign body without success.  He denies any fevers, chills or purulent drainage from underneath the nail.  He states it has been more than 5 years since his last tetanus shot. ? ? ? ?  ? ?Home Medications ?Prior to Admission medications   ?Medication Sig Start Date End Date Taking? Authorizing Provider  ?amLODipine (NORVASC) 5 MG tablet Take 1 tablet (5 mg total) by mouth daily. 03/12/18   Derwood Kaplan, MD  ?hydrOXYzine (ATARAX/VISTARIL) 25 MG tablet Take 1 tablet (25 mg total) by mouth every 6 (six) hours. 07/01/19   Bethel Born, PA-C  ?metFORMIN (GLUCOPHAGE) 500 MG tablet Take 1 tablet (500 mg total) by mouth 2 (two) times daily with a meal. 03/12/18 04/11/18  Little, Ambrose Finland, MD  ?   ? ?Allergies    ?Patient has no known allergies.   ? ?Review of Systems   ?Review of Systems  ?Constitutional:  Negative for chills and fever.  ?Skin:  Positive for wound.  ?All other systems reviewed and are negative. ? ?Physical Exam ?Updated Vital Signs ?BP 119/82 (BP Location: Right Arm)   Pulse 69   Temp 97.9 ?F (36.6 ?C) (Oral)   Resp 18   SpO2 98%  ?Physical Exam ?Vitals and nursing note reviewed.  ?Constitutional:   ?   General: He is not in acute distress. ?   Appearance: Normal appearance. He is normal weight. He is not ill-appearing, toxic-appearing or diaphoretic.  ?HENT:  ?   Head: Normocephalic  and atraumatic.  ?Cardiovascular:  ?   Rate and Rhythm: Normal rate.  ?Pulmonary:  ?   Effort: Pulmonary effort is normal. No respiratory distress.  ?Musculoskeletal:     ?   General: Normal range of motion.  ?   Cervical back: Normal range of motion.  ?   Comments: Splinter located underneath the left index finger the lateral portion.  No erythema, fluctuance, or induration noted.  No drainage.  Capillary refill less than 2 seconds.  ?Skin: ?   General: Skin is warm and dry.  ?Neurological:  ?   General: No focal deficit present.  ?   Mental Status: He is alert.  ?Psychiatric:     ?   Mood and Affect: Mood normal.     ?   Behavior: Behavior normal.  ? ? ?ED Results / Procedures / Treatments   ?Labs ?(all labs ordered are listed, but only abnormal results are displayed) ?Labs Reviewed - No data to display ? ?EKG ?None ? ?Radiology ?No results found. ? ?Procedures ?Marland KitchenForeign Body Removal ? ?Date/Time: 01/05/2022 6:58 PM ?Performed by: Silva Bandy, PA-C ?Authorized by: Silva Bandy, PA-C  ?Consent: Verbal consent obtained. Written consent not obtained. ?Risks and benefits: risks, benefits and alternatives were discussed ?Consent given by: patient ?Patient understanding: patient states understanding of the procedure  being performed ?Patient consent: the patient's understanding of the procedure matches consent given ?Procedure consent: procedure consent matches procedure scheduled ?Relevant documents: relevant documents present and verified ?Patient identity confirmed: verbally with patient ?Body area: skin ?General location: upper extremity ?Location details: left index finger ?Anesthesia: digital block ? ?Anesthesia: ?Local Anesthetic: lidocaine 1% without epinephrine ?Anesthetic total: 5 mL ? ?Sedation: ?Patient sedated: no ? ?Patient restrained: no ?Patient cooperative: yes ?Removal mechanism: forceps ?Dressing: antibiotic ointment and dressing applied ?Depth: subcutaneous ?Complexity: simple ?1 objects  recovered. ?Objects recovered: Wooden splinter ?Post-procedure assessment: foreign body removed ?Patient tolerance: patient tolerated the procedure well with no immediate complications ?  ? ? ?Medications Ordered in ED ?Medications  ?lidocaine (PF) (XYLOCAINE) 1 % injection 10 mL (has no administration in time range)  ? ? ?ED Course/ Medical Decision Making/ A&P ?  ?                        ?Medical Decision Making ?Risk ?Prescription drug management. ? ? ?Patient presents today with wooden splinter underneath his left index finger.  He is afebrile, nontoxic-appearing, and in no acute distress with reassuring vital signs.  I was able to cut the lateral portion of the nail down to the base leaving the most distal portion intact as well as the entire medial portion of the nail.  Splinter removed in its entirety with no visualization nation of retained foreign body.  No signs of associated infection at this time.  Patient without any comorbid conditions that would impact wound healing, therefore topical antibiotics given only.  No oral antibiotics.  Tdap updated.  No further concerns at this time, educated on red flag symptoms that would prompt immediate return.  Discharged stable condition. ? ? ?Final Clinical Impression(s) / ED Diagnoses ?Final diagnoses:  ?Wood splinter under fingernail  ? ? ?Rx / DC Orders ?ED Discharge Orders   ? ? None  ? ?  ?An After Visit Summary was printed and given to the patient. ? ? ?  ?Vear Clock ?01/05/22 1904 ? ?  ?Alvira Monday, MD ?01/06/22 1350 ? ?

## 2022-01-05 NOTE — ED Triage Notes (Addendum)
PT reports getting a splinter under the nail of his left index finger.Unsure if it is metal or wood. This occurred on Friday. ?

## 2022-01-05 NOTE — Discharge Instructions (Signed)
A wooden splinter was removed from underneath her left index finger.  I have applied topical antibiotic ointment and a dressing which should leave on the finger for the next 24 hours.  Afterwards please remove the dressing and rinse thoroughly with soap and water.  I have given you additional antibiotic ointment for you to apply to the wound as well as additional dressings.  Please use these and monitor your wound closely for signs of infection including fevers, chills, redness, warmth, drainage, or significant increase in pain.  These would be reasons to return to the emergency department for further evaluation and management. ?

## 2022-09-30 ENCOUNTER — Emergency Department (HOSPITAL_BASED_OUTPATIENT_CLINIC_OR_DEPARTMENT_OTHER)
Admission: EM | Admit: 2022-09-30 | Discharge: 2022-09-30 | Disposition: A | Discharge status: Home / Self Care | Payer: BC Managed Care – PPO | Attending: Emergency Medicine | Admitting: Emergency Medicine

## 2022-09-30 ENCOUNTER — Other Ambulatory Visit: Payer: Self-pay

## 2022-09-30 ENCOUNTER — Other Ambulatory Visit (HOSPITAL_BASED_OUTPATIENT_CLINIC_OR_DEPARTMENT_OTHER): Payer: Self-pay

## 2022-09-30 DIAGNOSIS — E119 Type 2 diabetes mellitus without complications: Secondary | ICD-10-CM | POA: Insufficient documentation

## 2022-09-30 DIAGNOSIS — Z7984 Long term (current) use of oral hypoglycemic drugs: Secondary | ICD-10-CM | POA: Insufficient documentation

## 2022-09-30 DIAGNOSIS — H00012 Hordeolum externum right lower eyelid: Secondary | ICD-10-CM | POA: Insufficient documentation

## 2022-09-30 MED ORDER — CLINDAMYCIN HCL 300 MG PO CAPS
300.0000 mg | ORAL_CAPSULE | Freq: Four times a day (QID) | ORAL | 0 refills | Status: AC
  Filled 2022-09-30: qty 28, 7d supply, fill #0

## 2022-09-30 NOTE — ED Provider Notes (Signed)
[place] EMERGENCY DEPARTMENT Provider Note   CSN: [id number] Arrival date & time: 09/30/22  1147     History  Chief Complaint  Patient presents with   Eye Pain    Jeremy Mueller is a 42 y.o. male.  Patient is a 42 year old male who presents with infection under his right eye.  He said it started with a little bump about a month ago but got markedly more swollen and painful over the last couple days.  It was not tender a month ago.  He denies any drainage from the eye.  No redness to the eye.  No change in his vision.  No fevers.  He is a known diabetic.       Home Medications Prior to Admission medications   Medication Sig Start Date End Date Taking? Authorizing Provider  clindamycin (CLEOCIN) 300 MG capsule Take 1 capsule (300 mg total) by mouth 4 (four) times daily. X 7 days 09/30/22  Yes Malvin Johns, MD  amLODipine (NORVASC) 5 MG tablet Take 1 tablet (5 mg total) by mouth daily. 03/12/18   Varney Biles, MD  Amphetamine-Dextroamphetamine (AMPHETAMINE SALT COMBO PO) Take by mouth.    [provider]  hydrOXYzine (ATARAX/VISTARIL) 25 MG tablet Take 1 tablet (25 mg total) by mouth every 6 (six) hours. 07/01/19   Recardo Evangelist, PA-C  losartan (COZAAR) 25 MG tablet Take 25 mg by mouth daily.    [provider]  metFORMIN (GLUCOPHAGE) 1000 MG tablet Take 1,000 mg by mouth 2 (two) times daily with a meal.    [provider]  metFORMIN (GLUCOPHAGE) 500 MG tablet Take 1 tablet (500 mg total) by mouth 2 (two) times daily with a meal. 03/12/18 04/11/18  Little, Wenda Overland, MD      Allergies    Patient has no known allergies.    Review of Systems   Review of Systems  Constitutional:  Negative for fever.  Eyes:  Negative for photophobia, discharge, redness, itching and visual disturbance.       Bump to eyelid  Gastrointestinal:  Negative for nausea and vomiting.  Skin:  Positive for wound.  Neurological:  Negative for headaches.     Physical Exam Updated Vital Signs BP (!) 146/89 (BP Location: Left Arm)   Pulse 76   Temp 98.8 F (37.1 C) (Oral)   Resp 18   Ht 6\' 3"  (1.905 m)   Wt 131.1 kg   SpO2 97%   BMI 36.12 kg/m  Physical Exam HENT:     Head: Normocephalic and atraumatic.  Eyes:     Comments: Patient has a large stye to the right lower eyelid.  There are some surrounding erythema.  No pain with eye movements.  No drainage.  No conjunctival injection.  Cardiovascular:     Rate and Rhythm: Normal rate.  Pulmonary:     Effort: Pulmonary effort is normal.  Skin:    General: Skin is warm and dry.  Neurological:     Mental Status: He is alert.     ED Results / Procedures / Treatments   Labs (all labs ordered are listed, but only abnormal results are displayed) Labs Reviewed - No data to display  EKG None  Radiology No results found.  Procedures Procedures    Medications Ordered in ED Medications - No data to display  ED Course/ Medical Decision Making/ A&P  Medical Decision Making Risk Prescription drug management.   Patient is a 42 year old male who presents with a stye under his right eye.  No vision changes or eye involvement.  He has some swelling and erythema around the area.  Will start on antibiotics.  Advised him to do warm compresses.  Will refer to ophthalmology for follow-up.  Return precautions given.  Final Clinical Impression(s) / ED Diagnoses Final diagnoses:  Hordeolum externum of right lower eyelid    Rx / DC Orders ED Discharge Orders          Ordered    clindamycin (CLEOCIN) 300 MG capsule  4 times daily        09/30/22 1231              Malvin Johns, MD 09/30/22 1259

## 2022-09-30 NOTE — Discharge Instructions (Signed)
Use warm compresses to the eye.  Take the antibiotics as discussed.  Follow-up with the ophthalmologist listed above if it is not better in the next couple days.  Return to the emergency room if you have any worsening symptoms.

## 2022-09-30 NOTE — ED Triage Notes (Signed)
Patient presents to ED via POV from home. Here with right eye pain. Reports "I was told I have a stye but its getting worse". Denies change in vision.

## 2022-12-09 ENCOUNTER — Other Ambulatory Visit: Payer: Self-pay

## 2022-12-09 ENCOUNTER — Emergency Department (HOSPITAL_COMMUNITY)
Admission: EM | Admit: 2022-12-09 | Discharge: 2022-12-09 | Disposition: A | Discharge status: Home / Self Care | Payer: Commercial Managed Care - PPO | Attending: Emergency Medicine | Admitting: Emergency Medicine

## 2022-12-09 DIAGNOSIS — M546 Pain in thoracic spine: Secondary | ICD-10-CM | POA: Diagnosis not present

## 2022-12-09 DIAGNOSIS — M62830 Muscle spasm of back: Secondary | ICD-10-CM | POA: Insufficient documentation

## 2022-12-09 DIAGNOSIS — M549 Dorsalgia, unspecified: Secondary | ICD-10-CM | POA: Diagnosis present

## 2022-12-09 MED ORDER — NAPROXEN 250 MG PO TABS
500.0000 mg | ORAL_TABLET | Freq: Once | ORAL | Status: AC
  Administered 2022-12-09: 500 mg via ORAL
  Filled 2022-12-09: qty 2

## 2022-12-09 MED ORDER — LIDOCAINE 5 % EX PTCH: 1.0000 | MEDICATED_PATCH | CUTANEOUS | 0 refills | Status: AC

## 2022-12-09 MED ORDER — LIDOCAINE 5 % EX PTCH
1.0000 | MEDICATED_PATCH | CUTANEOUS | Status: DC
  Administered 2022-12-09: 1 via TRANSDERMAL
  Filled 2022-12-09: qty 1

## 2022-12-09 MED ORDER — METHOCARBAMOL 500 MG PO TABS: 500.0000 mg | ORAL_TABLET | Freq: Two times a day (BID) | ORAL | 0 refills | Status: AC

## 2022-12-09 MED ORDER — ACETAMINOPHEN 500 MG PO TABS: 500.0000 mg | ORAL_TABLET | Freq: Four times a day (QID) | ORAL | 0 refills | Status: AC | PRN

## 2022-12-09 MED ORDER — NAPROXEN 500 MG PO TABS: 500.0000 mg | ORAL_TABLET | Freq: Two times a day (BID) | ORAL | 0 refills | Status: AC

## 2022-12-09 NOTE — ED Provider Notes (Signed)
[facility] Provider Note   CSN: [id number] Arrival date & time: 12/09/22  1731     History  Chief Complaint  Patient presents with   Back Pain    Jeremy Mueller is a 42 y.o. male presenting today with back pain.  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  No history of kidney stone.  No dysuria or hematuria.  No saddle anesthesia, bowel/bladder dysfunction, leg weakness, falls, fevers or IVDU.  Back Pain Associated symptoms: no dysuria and no fever        Home Medications Prior to Admission medications   Medication Sig Start Date End Date Taking? Authorizing Provider  amLODipine (NORVASC) 5 MG tablet Take 1 tablet (5 mg total) by mouth daily. 03/12/18   Varney Biles, MD  Amphetamine-Dextroamphetamine (AMPHETAMINE SALT COMBO PO) Take by mouth.    [provider]  clindamycin (CLEOCIN) 300 MG capsule Take 1 capsule (300 mg total) by mouth 4 (four) times daily. X 7 days 09/30/22   Malvin Johns, MD  hydrOXYzine (ATARAX/VISTARIL) 25 MG tablet Take 1 tablet (25 mg total) by mouth every 6 (six) hours. 07/01/19   Recardo Evangelist, PA-C  losartan (COZAAR) 25 MG tablet Take 25 mg by mouth daily.    [provider]  metFORMIN (GLUCOPHAGE) 1000 MG tablet Take 1,000 mg by mouth 2 (two) times daily with a meal.    [provider]  metFORMIN (GLUCOPHAGE) 500 MG tablet Take 1 tablet (500 mg total) by mouth 2 (two) times daily with a meal. 03/12/18 04/11/18  Little, Wenda Overland, MD      Allergies    Patient has no known allergies.    Review of Systems   Review of Systems  Constitutional:  Negative for chills and fever.  Genitourinary:  Negative for difficulty urinating, dysuria, flank pain and hematuria.  Musculoskeletal:  Positive for back pain.    Physical Exam Updated Vital Signs BP 135/82   Pulse 89   Temp 98 F  (36.7 C)   Resp 19   SpO2 98%  Physical Exam Vitals and nursing note reviewed.  Constitutional:      Appearance: Normal appearance.  HENT:     Head: Normocephalic and atraumatic.  Eyes:     General: No scleral icterus.    Conjunctiva/sclera: Conjunctivae normal.  Pulmonary:     Effort: Pulmonary effort is normal. No respiratory distress.  Abdominal:     Tenderness: There is no right CVA tenderness or left CVA tenderness.  Musculoskeletal:     Comments: Palpable muscle spasm to the left thoracic spine.  No midline tenderness  Skin:    Findings: No rash.  Neurological:     Mental Status: He is alert.  Psychiatric:        Mood and Affect: Mood normal.     ED Results / Procedures / Treatments   Labs (all labs ordered are listed, but only abnormal results are displayed) Labs Reviewed - No data to display  EKG None  Radiology No results found.  Procedures Procedures   Medications Ordered in ED Medications - No data to display  ED Course/ Medical Decision Making/ A&P Clinical Course as of 12/09/22 2305  Mon Dec 09, 2022  2220 Martin Majestic to evaluate the patient and he is not in the hallway [MR]    Clinical Course User Index [MR] [place], [place],  PA-C                             Medical Decision Making  42 year old male presenting today with back pain.  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  No red flag symptoms.  On physical exam there is a palpable muscle spasm, likely the cause of his pain.  No midline tenderness.  Treatment: Naproxen and Lidoderm  MDM/disposition: 42 year old male presenting with back pain.  Acute onset after tweaking his back.  No red flags.  Considered imaging however no bony tenderness or trauma indicating the necessity for imaging.  He was treated with NSAID and Lidoderm patch.  Muscle relaxants Lidoderm sent to his pharmacy.  Return precautions were discussed and he was given a  work note.  Final Clinical Impression(s) / ED Diagnoses Final diagnoses:  Spasm of thoracic back muscle    Rx / DC Orders ED Discharge Orders          Ordered    naproxen (NAPROSYN) 500 MG tablet  2 times daily        12/09/22 2305    acetaminophen (TYLENOL) 500 MG tablet  Every 6 hours PRN        12/09/22 2305    methocarbamol (ROBAXIN) 500 MG tablet  2 times daily        12/09/22 2305    lidocaine (LIDODERM) 5 %  Every 24 hours        12/09/22 2305           Results and diagnoses were explained to the patient. Return precautions discussed in full. Patient had no additional questions and expressed complete understanding.   This chart was dictated using voice recognition software.  Despite best efforts to proofread,  errors can occur which can change the documentation meaning.    Darliss Ridgel 12/09/22 Rosa Sanchez, Nathan, MD 12/11/22 1054

## 2022-12-09 NOTE — Discharge Instructions (Addendum)
You came to the emergency department today with back pain.  You have a palpable muscle spasm.  I have sent the following medications to your pharmacy:  Naproxen.  This may be used for pain and inflammation Acetaminophen.  This may be alternated with ibuprofen for pain Methocarbamol.  This is a muscle relaxant.  Only use as needed and remember it may make you drowsy so do not drive on this medication.  Additionally do not drink alcohol with this Lidocaine patches.  As we discussed these should only be worn for around 12 hours at a time.  You must have 12 hours patch free  Also use heat packs for your discomfort. Your work note is attached and please do not hesitate to return with any worsening symptoms.

## 2022-12-09 NOTE — ED Triage Notes (Signed)
Pt was working, pointed at an object, and felt sharp spasm and pain in left upper back.

## 2022-12-09 NOTE — ED Provider Triage Note (Signed)
Emergency Medicine Provider Triage Evaluation Note  Jeremy Mueller , a 42 y.o. male  was evaluated in triage.  Pt complains of back pain onset PTA. Notes that he went to reach to point at something and noted pain to the area.  Tried heat pack prior to arrival.  Denies bowel/bladder incontinence, saddle paresthesia, numbness, tingling.  Review of Systems  Positive:  Negative:   Physical Exam  BP 135/82   Pulse 89   Temp 98 F (36.7 C)   Resp 19   SpO2 98%  Gen:   Awake, no distress   Resp:  Normal effort  MSK:   Moves extremities without difficulty  Other:  No spinal TTP. TTP noted to left thoracic musculature.   Medical Decision Making  Medically screening exam initiated at 7:20 PM.  Appropriate orders placed.  Dailon Sladek was informed that the remainder of the evaluation will be completed by another provider, this initial triage assessment does not replace that evaluation, and the importance of remaining in the ED until their evaluation is complete.  Work-up initiated   Anselm Aumiller A, PA-C 12/09/22 1920

## 2022-12-09 NOTE — ED Notes (Signed)
Pt presents ambulatory to room with steady gait. Pt c/o sharp spasm in upper left back area after pointing at something while working. Pt denies injury. Pt has full rom denies numbness or tingling to any extremities. Pt a/o x 4 respirations even and non labored.

## 2022-12-19 DIAGNOSIS — I10 Essential (primary) hypertension: Secondary | ICD-10-CM | POA: Diagnosis not present

## 2022-12-19 DIAGNOSIS — R41840 Attention and concentration deficit: Secondary | ICD-10-CM | POA: Diagnosis not present

## 2022-12-19 DIAGNOSIS — E119 Type 2 diabetes mellitus without complications: Secondary | ICD-10-CM | POA: Diagnosis not present

## 2022-12-19 DIAGNOSIS — E6609 Other obesity due to excess calories: Secondary | ICD-10-CM | POA: Diagnosis not present

## 2022-12-19 DIAGNOSIS — Z6835 Body mass index (BMI) 35.0-35.9, adult: Secondary | ICD-10-CM | POA: Diagnosis not present

## 2022-12-19 DIAGNOSIS — Z13220 Encounter for screening for lipoid disorders: Secondary | ICD-10-CM | POA: Diagnosis not present

## 2023-01-24 ENCOUNTER — Other Ambulatory Visit (HOSPITAL_BASED_OUTPATIENT_CLINIC_OR_DEPARTMENT_OTHER): Payer: Self-pay

## 2023-01-24 ENCOUNTER — Other Ambulatory Visit (HOSPITAL_COMMUNITY): Payer: Self-pay

## 2023-01-27 ENCOUNTER — Other Ambulatory Visit (HOSPITAL_BASED_OUTPATIENT_CLINIC_OR_DEPARTMENT_OTHER): Payer: Self-pay

## 2023-01-28 ENCOUNTER — Other Ambulatory Visit (HOSPITAL_BASED_OUTPATIENT_CLINIC_OR_DEPARTMENT_OTHER): Payer: Self-pay

## 2023-01-28 MED ORDER — AMLODIPINE BESYLATE 10 MG PO TABS
10.0000 mg | ORAL_TABLET | Freq: Every day | ORAL | 1 refills | Status: AC
  Filled 2023-01-28: qty 90, 90d supply, fill #0

## 2023-01-28 MED ORDER — METFORMIN HCL ER 500 MG PO TB24
500.0000 mg | ORAL_TABLET | Freq: Two times a day (BID) | ORAL | 3 refills | Status: AC
  Filled 2023-01-28: qty 180, 90d supply, fill #0

## 2023-01-28 MED ORDER — OZEMPIC (1 MG/DOSE) 4 MG/3ML ~~LOC~~ SOPN
1.0000 mg | PEN_INJECTOR | SUBCUTANEOUS | 0 refills | Status: AC
  Filled 2023-01-28: qty 3, 28d supply, fill #0

## 2023-02-07 ENCOUNTER — Other Ambulatory Visit (HOSPITAL_BASED_OUTPATIENT_CLINIC_OR_DEPARTMENT_OTHER): Payer: Self-pay

## 2023-03-12 ENCOUNTER — Other Ambulatory Visit (HOSPITAL_BASED_OUTPATIENT_CLINIC_OR_DEPARTMENT_OTHER): Payer: Self-pay
